# Patient Record
Sex: Male | Born: 1965 | Race: White | Hispanic: No | Smoking: Never smoker
Health system: Southern US, Community
[De-identification: ages and names within clinical notes are randomized; demographics above are authoritative.]

## PROBLEM LIST (undated history)

## (undated) DIAGNOSIS — E785 Hyperlipidemia, unspecified: Secondary | ICD-10-CM

## (undated) DIAGNOSIS — G709 Myoneural disorder, unspecified: Secondary | ICD-10-CM

## (undated) HISTORY — PX: INNER EAR SURGERY: SHX679

## (undated) HISTORY — PX: FINGER SURGERY: SHX640

## (undated) HISTORY — PX: TONSILLECTOMY: SUR1361

## (undated) HISTORY — DX: Hyperlipidemia, unspecified: E78.5

---

## 2008-04-05 ENCOUNTER — Encounter: Admission: RE | Admit: 2008-04-05 | Discharge: 2008-04-05 | Payer: Self-pay | Admitting: Neurology

## 2010-12-28 ENCOUNTER — Encounter: Payer: Self-pay | Admitting: Neurology

## 2011-07-27 LAB — LAB REPORT - SCANNED: HM HIV Screening: NEGATIVE

## 2011-10-20 ENCOUNTER — Other Ambulatory Visit: Payer: Self-pay | Admitting: Neurology

## 2011-10-20 DIAGNOSIS — R209 Unspecified disturbances of skin sensation: Secondary | ICD-10-CM

## 2011-10-20 DIAGNOSIS — R202 Paresthesia of skin: Secondary | ICD-10-CM

## 2011-11-11 ENCOUNTER — Ambulatory Visit
Admission: RE | Admit: 2011-11-11 | Discharge: 2011-11-11 | Disposition: A | Discharge status: Home / Self Care | Payer: PRIVATE HEALTH INSURANCE | Source: Ambulatory Visit | Attending: Neurology | Admitting: Neurology

## 2011-11-11 DIAGNOSIS — R202 Paresthesia of skin: Secondary | ICD-10-CM

## 2011-11-11 DIAGNOSIS — R209 Unspecified disturbances of skin sensation: Secondary | ICD-10-CM

## 2011-11-11 MED ORDER — IOHEXOL 300 MG/ML  SOLN
75.0000 mL | Freq: Once | INTRAMUSCULAR | Status: AC | PRN
  Administered 2011-11-11: 75 mL via INTRAVENOUS

## 2011-11-16 ENCOUNTER — Other Ambulatory Visit: Payer: Self-pay | Admitting: Neurology

## 2011-11-16 DIAGNOSIS — R202 Paresthesia of skin: Secondary | ICD-10-CM

## 2011-11-24 ENCOUNTER — Ambulatory Visit
Admission: RE | Admit: 2011-11-24 | Discharge: 2011-11-24 | Disposition: A | Discharge status: Home / Self Care | Payer: PRIVATE HEALTH INSURANCE | Source: Ambulatory Visit | Attending: Neurology | Admitting: Neurology

## 2011-11-24 DIAGNOSIS — R202 Paresthesia of skin: Secondary | ICD-10-CM

## 2011-11-24 NOTE — Progress Notes (Signed)
Brent Jones pt's blood to be sent with spinal fluid. Pt tolerated procedure well. Left antecubital site is unremarkable. Awaiting Dr. Benard Rink to do LP.

## 2011-11-24 NOTE — Patient Instructions (Addendum)
Lumbar Puncture Discharge Instructions  1. Go home and rest quietly for the next 24 hours.  It is important to lie flat for the next 24 hours.  Get up only to go to the restroom.  You may lie in the bed or on a couch on your back, your stomach, your left side or your right side.  You may have one pillow under your head.  You may have pillows between your knees while you are on your side or under your knees while you are on your back.  2. DO NOT drive today.  Recline the seat as far back as it will go, while still wearing your seat belt, on the way home.  3. You may get up to go to the bathroom as needed.  You may sit up for 10 minutes to eat.  You may resume your normal diet and medications unless otherwise indicated. Make sure to drink plenty of fluids today and tomorrow.  4. The incidence of headache, nausea, or vomiting is about 5% (one in 20 patients).  If you develop a headache, lie flat and drink plenty of fluids until the headache goes away.  Caffeinated beverages may be helpful.  If you develop severe nausea and vomiting or a headache that does not go away with flat bed rest, call [phone].  5. You may resume normal activities after your 24 hours of bed rest is over; however, do not exert yourself strongly or do any heavy lifting tomorrow.  6. Call your physician for a follow-up appointment.      7. If you have any questions or if complications develop after you arrive home, please call [phone].  Discharge instructions have been explained to the patient.  The patient, or the person responsible for the patient, fully understands these instructions.

## 2011-12-15 ENCOUNTER — Emergency Department (HOSPITAL_COMMUNITY)
Admission: EM | Admit: 2011-12-15 | Discharge: 2011-12-15 | Disposition: A | Discharge status: Home / Self Care | Payer: PRIVATE HEALTH INSURANCE | Attending: Emergency Medicine | Admitting: Emergency Medicine

## 2011-12-15 ENCOUNTER — Other Ambulatory Visit (HOSPITAL_COMMUNITY): Payer: PRIVATE HEALTH INSURANCE

## 2011-12-15 ENCOUNTER — Emergency Department (HOSPITAL_COMMUNITY): Payer: PRIVATE HEALTH INSURANCE

## 2011-12-15 ENCOUNTER — Encounter: Payer: Self-pay | Admitting: *Deleted

## 2011-12-15 DIAGNOSIS — R1031 Right lower quadrant pain: Secondary | ICD-10-CM

## 2011-12-15 DIAGNOSIS — R109 Unspecified abdominal pain: Secondary | ICD-10-CM | POA: Insufficient documentation

## 2011-12-15 LAB — COMPREHENSIVE METABOLIC PANEL
ALT: 25 U/L (ref 0–53)
AST: 29 U/L (ref 0–37)
Albumin: 4.7 g/dL (ref 3.5–5.2)
Alkaline Phosphatase: 72 U/L (ref 39–117)
Chloride: 101 mEq/L (ref 96–112)
Potassium: 3.7 mEq/L (ref 3.5–5.1)
Sodium: 137 mEq/L (ref 135–145)
Total Protein: 7.9 g/dL (ref 6.0–8.3)

## 2011-12-15 LAB — DIFFERENTIAL
Basophils Relative: 0 % (ref 0–1)
Eosinophils Absolute: 0 10*3/uL (ref 0.0–0.7)
Lymphs Abs: 1.5 10*3/uL (ref 0.7–4.0)
Neutro Abs: 12.5 10*3/uL — ABNORMAL HIGH (ref 1.7–7.7)
Neutrophils Relative %: 82 % — ABNORMAL HIGH (ref 43–77)

## 2011-12-15 LAB — CBC
MCH: 28.9 pg (ref 26.0–34.0)
MCHC: 34 g/dL (ref 30.0–36.0)
Platelets: 210 10*3/uL (ref 150–400)
RBC: 5.01 MIL/uL (ref 4.22–5.81)

## 2011-12-15 MED ORDER — OXYCODONE-ACETAMINOPHEN 5-325 MG PO TABS: 1.0000 | ORAL_TABLET | ORAL | Status: AC | PRN

## 2011-12-15 MED ORDER — ONDANSETRON HCL 4 MG/2ML IJ SOLN
4.0000 mg | INTRAMUSCULAR | Status: DC | PRN
  Administered 2011-12-15: 4 mg via INTRAVENOUS
  Filled 2011-12-15: qty 2

## 2011-12-15 MED ORDER — MORPHINE SULFATE 2 MG/ML IJ SOLN
2.0000 mg | INTRAMUSCULAR | Status: DC | PRN
  Administered 2011-12-15: 2 mg via INTRAVENOUS
  Filled 2011-12-15: qty 1

## 2011-12-15 MED ORDER — SODIUM CHLORIDE 0.9 % IV SOLN
Freq: Once | INTRAVENOUS | Status: AC
  Administered 2011-12-15: 11:00:00 via INTRAVENOUS

## 2011-12-15 MED ORDER — MORPHINE SULFATE 4 MG/ML IJ SOLN
4.0000 mg | Freq: Once | INTRAMUSCULAR | Status: AC
  Administered 2011-12-15: 4 mg via INTRAVENOUS
  Filled 2011-12-15: qty 1

## 2011-12-15 MED ORDER — KETOROLAC TROMETHAMINE 30 MG/ML IJ SOLN
30.0000 mg | Freq: Once | INTRAMUSCULAR | Status: AC
  Administered 2011-12-15: 30 mg via INTRAVENOUS
  Filled 2011-12-15: qty 1

## 2011-12-15 NOTE — ED Notes (Signed)
[id number]<AV> Expected date:<BR> Expected time:<BR> Means of arrival:<BR> Comments:<BR> Hyder from [employer]

## 2011-12-15 NOTE — ED Notes (Signed)
Pt given discharge instructions and verbalizes understanding  

## 2011-12-15 NOTE — ED Notes (Signed)
Pt reports acute onset RLQ pain at 0630 this am. Describes as colicky, then constant. Denies n/v/d.

## 2011-12-15 NOTE — ED Notes (Signed)
Sent for r/o appy.

## 2011-12-15 NOTE — ED Provider Notes (Signed)
History     CSN: [id number]  Arrival date & time 12/15/11  1038   None     Chief Complaint  Patient presents with  . Abdominal Pain    (Consider location/radiation/quality/duration/timing/severity/associated sxs/prior treatment) HPI  History reviewed. No pertinent past medical history.  Past Surgical History  Procedure Date  . Inner ear surgery implantable hearing aid-L  . Tonsillectomy   . Finger surgery     No family history on file.  History  Substance Use Topics  . Smoking status: Never Smoker   . Smokeless tobacco: Not on file  . Alcohol Use: Yes     occasionally      Review of Systems  Allergies  Review of patient's allergies indicates no known allergies.  Home Medications  No current outpatient prescriptions on file.  There were no vitals taken for this visit.  Physical Exam  ED Course  Procedures (including critical care time)   Labs Reviewed  CBC  DIFFERENTIAL  COMPREHENSIVE METABOLIC PANEL   No results found.   No diagnosis found.    MDM  Pt not seen by me        Doug Sou, MD 12/15/11 1247

## 2011-12-15 NOTE — Consult Note (Signed)
Reason for Consult:  Acute right lower quadrant pain Referring Physician:   Self  Brent Jones is an 46 y.o. male.  HPI: He awoke this morning with right lower quadrant pain that was colicky in nature.  The pain progressively worsened. I spoke with his wife and directed her to bring him to the [facility] emergency department so he can be evaluated.   The pain is associated with some nausea but no dysuria or hematuria. The pain radiates around to the right flank and he does feel some pressure in the suprapubic region. No fever is present but he has had some chills and sweats. He had a normal bowel movement this morning without relief of the pain. He has no history of gastrointestinal illnesses or kidney stones.  Past Medical History  Diagnosis Date  . Hearing loss     Past Surgical History  Procedure Date  . Inner ear surgery implantable hearing aid-L  . Tonsillectomy   . Finger surgery     Family History  Problem Relation Age of Onset  . Diabetes Father   . Hypertension Father     Social History:  reports that he has never smoked. He does not have any smokeless tobacco history on file. He reports that he drinks alcohol. He reports that he does not use illicit drugs.  Allergies: No Known Allergies  Medications: I have reviewed the patient's current medications which are none.  Results for orders placed during the hospital encounter of 12/15/11 (from the past 48 hour(s))  CBC     Status: Abnormal   Collection Time   12/15/11 10:51 AM      Component Value Range Comment   WBC 15.3 (*) 4.0 - 10.5 (K/uL)    RBC 5.01  4.22 - 5.81 (MIL/uL)    Hemoglobin 14.5  13.0 - 17.0 (g/dL)    HCT 16.1  09.6 - 04.5 (%)    MCV 85.0  78.0 - 100.0 (fL)    MCH 28.9  26.0 - 34.0 (pg)    MCHC 34.0  30.0 - 36.0 (g/dL)    RDW 40.9  81.1 - 91.4 (%)    Platelets 210  150 - 400 (K/uL)   DIFFERENTIAL     Status: Abnormal   Collection Time   12/15/11 10:51 AM      Component Value Range Comment   Neutrophils Relative 82 (*) 43 - 77 (%)    Neutro Abs 12.5 (*) 1.7 - 7.7 (K/uL)    Lymphocytes Relative 10 (*) 12 - 46 (%)    Lymphs Abs 1.5  0.7 - 4.0 (K/uL)    Monocytes Relative 8  3 - 12 (%)    Monocytes Absolute 1.2 (*) 0.1 - 1.0 (K/uL)    Eosinophils Relative 0  0 - 5 (%)    Eosinophils Absolute 0.0  0.0 - 0.7 (K/uL)    Basophils Relative 0  0 - 1 (%)    Basophils Absolute 0.0  0.0 - 0.1 (K/uL)     Ct Abdomen Pelvis Wo Contrast  12/15/2011  *RADIOLOGY REPORT*  Clinical Data: Right lower quadrant pain.  Renal colic.  CT ABDOMEN AND PELVIS WITHOUT CONTRAST  Technique:  Multidetector CT imaging of the abdomen and pelvis was performed following the standard protocol without intravenous contrast.  Comparison: None.  Findings: Several tiny 1-2 mm bilateral intrarenal calculi are seen.  Mild right hydronephrosis and ureterectasis is seen, with a 2-3 mm distal right ureteral calculus near the ureterovesicle junction.  There is  no evidence of left ureteral calculi or hydronephrosis.  A 3.9 cm cyst containing milk of calcium is noted in the upper pole of the left kidney.  The other abdominal parenchymal organs have a normal appearance on this noncontrast study.  Gallbladder is unremarkable.  No soft tissue masses are identified.  There is no evidence of inflammatory process or abnormal fluid collections.  No evidence of dilated bowel loops.  IMPRESSION:  1.  2-3 mm distal right ureteral calculus near the UVJ, causing mild right hydronephrosis. 2.  Tiny 1-2 mm nonobstructing bilateral intrarenal calculi. 3.  4 cm left upper pole renal cyst containing milk of calcium.  Original Report Authenticated By: Danae Orleans, M.D.    Review of Systems  Constitutional: Positive for chills and diaphoresis. Negative for fever.  HENT: Positive for hearing loss.   Respiratory: Negative for cough and shortness of breath.   Cardiovascular: Negative for chest pain.  Gastrointestinal: Positive for nausea and abdominal  pain. Negative for heartburn, vomiting and diarrhea.  Genitourinary: Negative for dysuria and hematuria.  Neurological: Positive for tingling (intermittent in LUE).   Blood pressure 115/61, pulse 50, temperature 98.7 F (37.1 C), temperature source Oral, resp. rate 18, SpO2 100.00%. Physical Exam  Constitutional: He appears well-developed and well-nourished.       Ill appearing.  HENT:  Head: Normocephalic and atraumatic.  Eyes: Conjunctivae and EOM are normal.  Neck: Neck supple.  Cardiovascular: Normal rate and regular rhythm.   No murmur heard. Respiratory: Effort normal and breath sounds normal.  GI: He exhibits no distension and no mass. There is tenderness (rlq, right flank, suprapubic areas). There is guarding.  Musculoskeletal: He exhibits no edema.  Lymphadenopathy:    He has no cervical adenopathy.  Skin: Skin is warm. He is diaphoretic.       Moist    Assessment/Plan: Right lower quadrant pain secondary to partially obstructing right ureteral stone which is currently at the urethrovesical junction. His symptoms are significantly improved since he was given Toradol. He has also had some morphine. I have discussed the situation with Dr. Barron Alvine from Urology.  No evidence of an acute right lower quadrant inflammatory process.  Plan: Discharge from the emergency department. We will try and see if he can spontaneously pass the stone.We'll have him strain his urine and collect the stone so he can take it to the urology office for evaluation. We'll have him use Toradol and Percocet for pain. He can arrange for followup with Dr. Isabel Caprice. If his clinical situation does not improve he may need cystoscopy and stone extraction and he knows to call Dr. Isabel Caprice for this as well.  Brent Jones J 12/15/2011, 11:51 AM

## 2012-03-14 IMAGING — CT CT ABD-PELV W/O CM
1 of 2 series · 15 of 32 positions shown, 19 images · non-contrast
Comparison: None.

CLINICAL DATA: Right lower quadrant pain.  Renal colic.

CT ABDOMEN AND PELVIS WITHOUT CONTRAST
TECHNIQUE: Multidetector CT imaging of the abdomen and pelvis was
performed following the standard protocol without intravenous
contrast.

[Series 2: abd/pel w/o · axial · non-contrast · 0.74mm/px · z∈[+1560,+1955]mm · 15 of 87 slices shown, 19 images]
[im 4/87  soft-tissue]
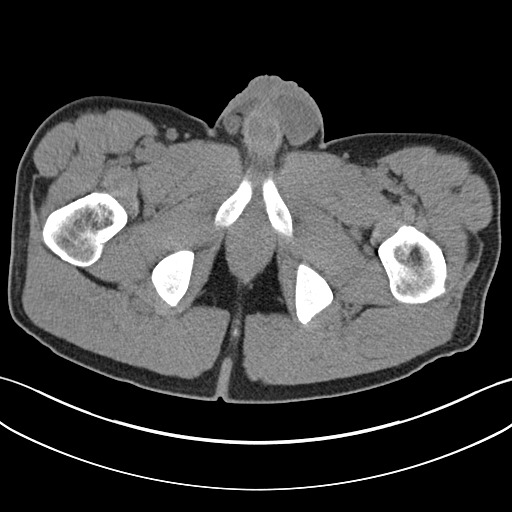
[im 4/87  bone]
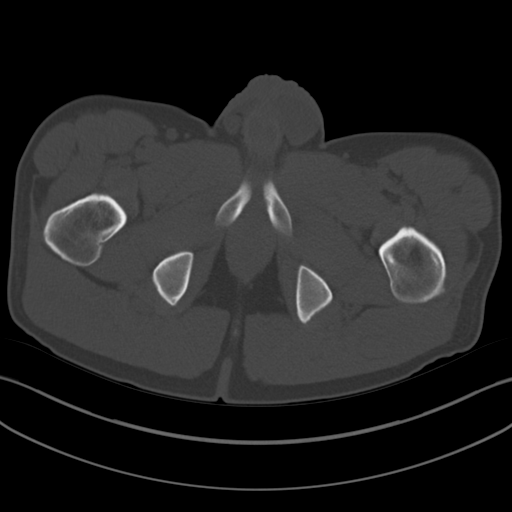
[im 11/87  soft-tissue]
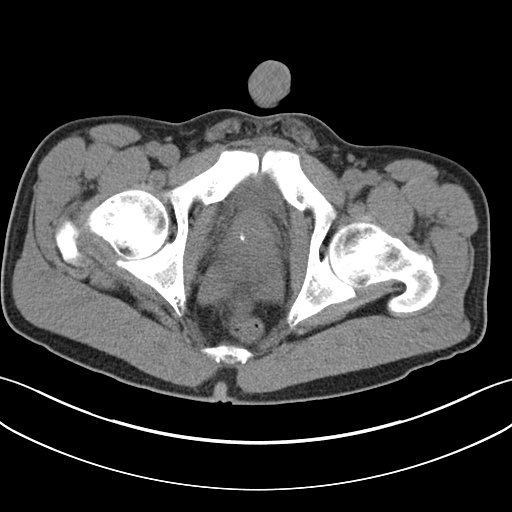
[im 18/87  soft-tissue]
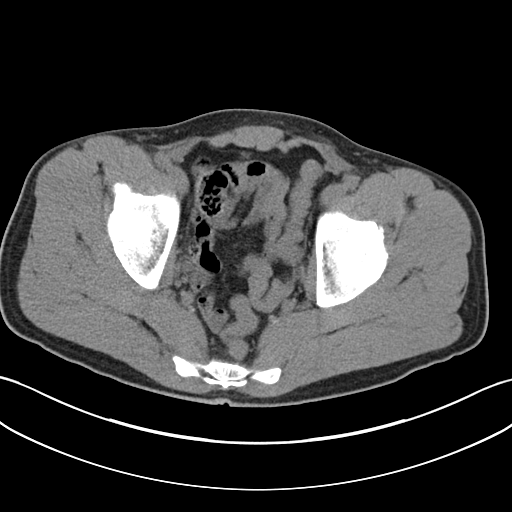
[im 26/87  soft-tissue]
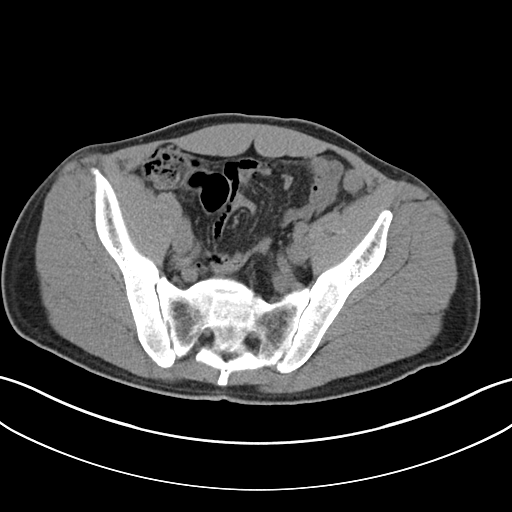
[im 29/87  soft-tissue]
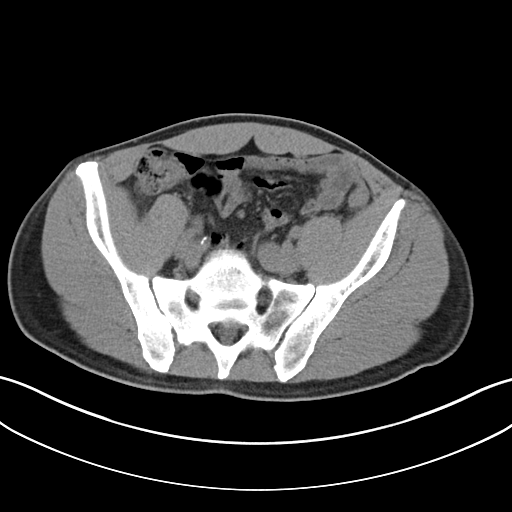
[im 36/87  soft-tissue]
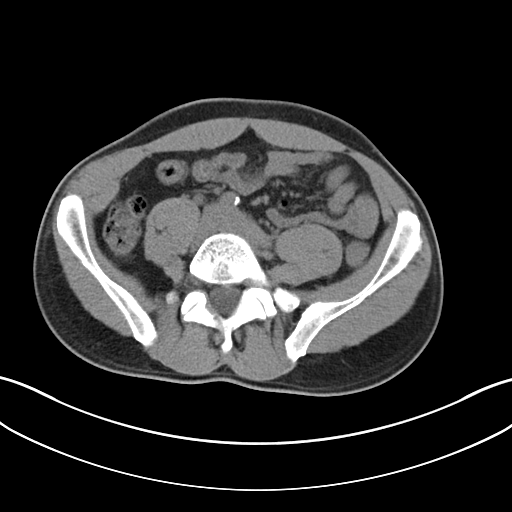
[im 44/87  soft-tissue]
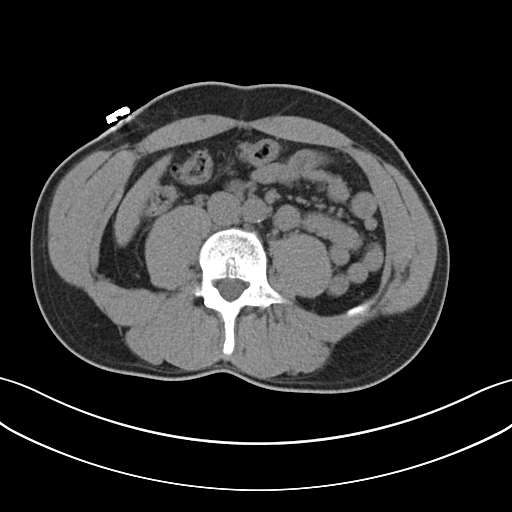
[im 51/87  soft-tissue]
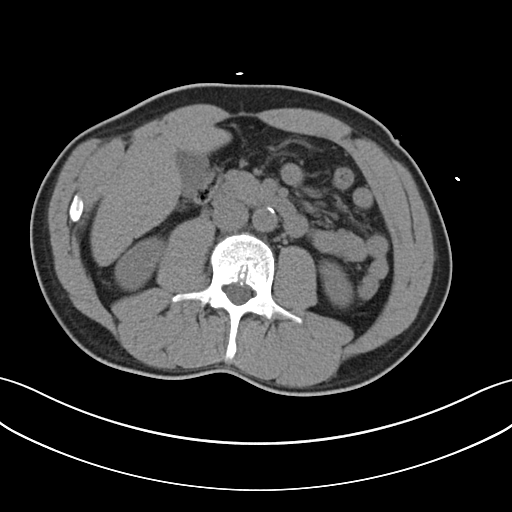
[im 58/87  soft-tissue]
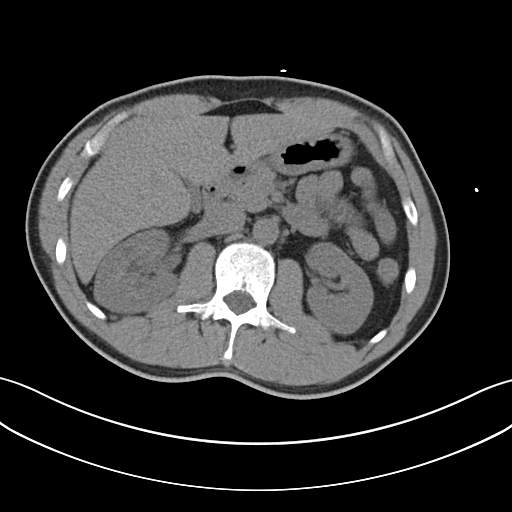
[im 58/87  bone]
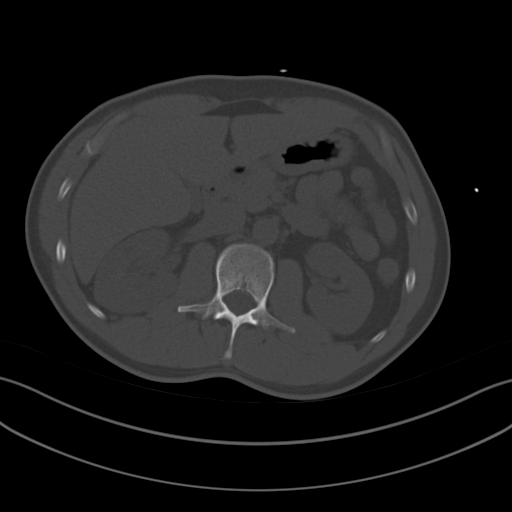
[im 61/87  soft-tissue]
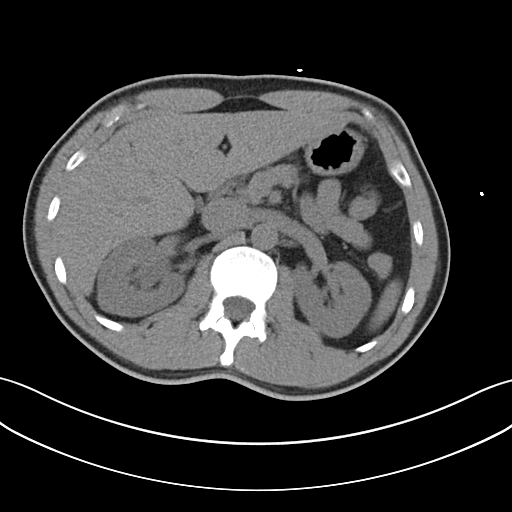
[im 69/87  soft-tissue]
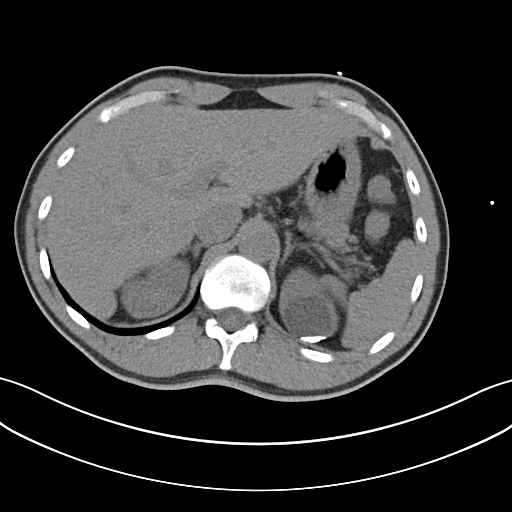
[im 72/87  lung]
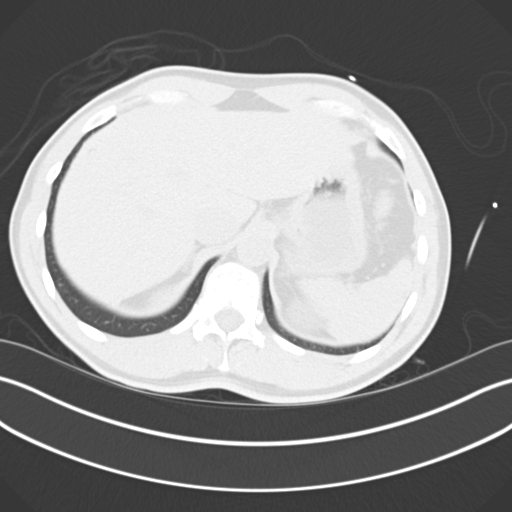
[im 76/87  soft-tissue]
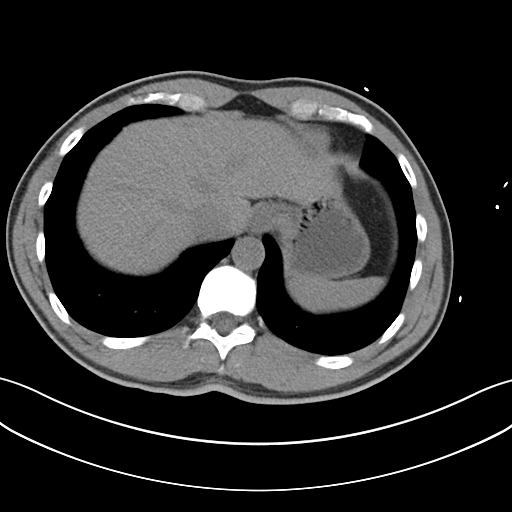
[im 76/87  lung]
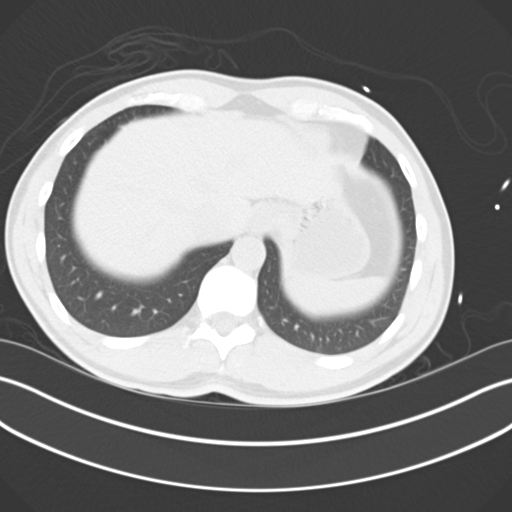
[im 79/87  lung]
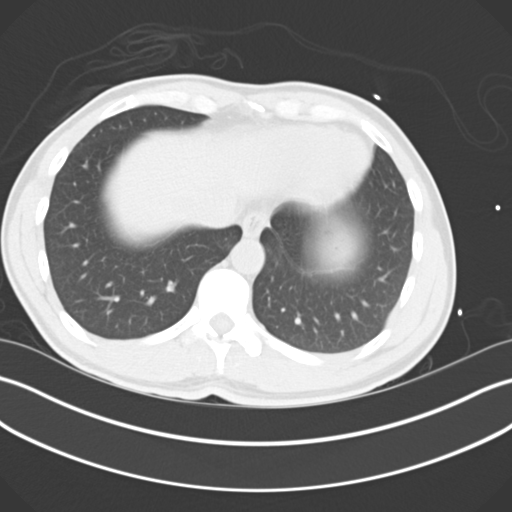
[im 83/87  soft-tissue]
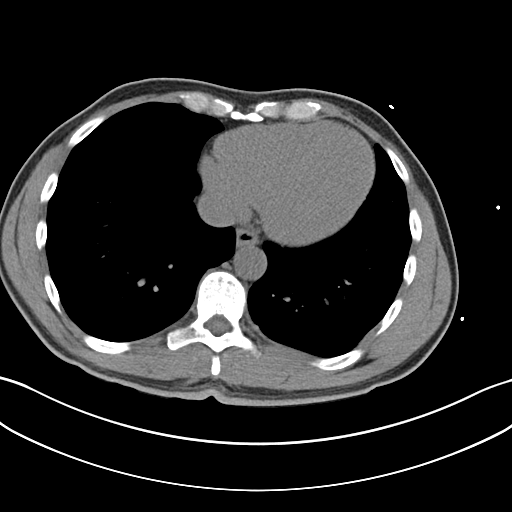
[im 83/87  lung]
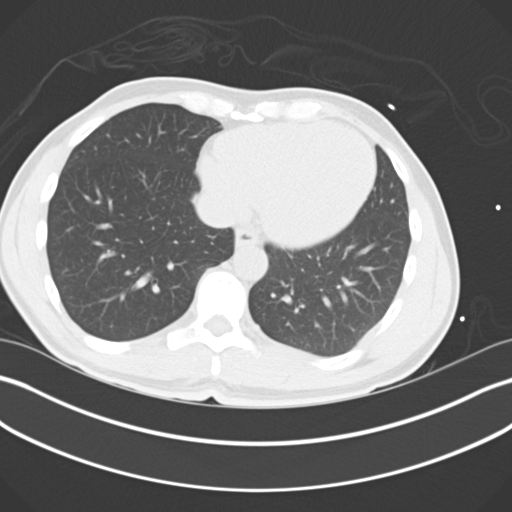

[15 of 32 positions shown; findings below may reference images not displayed]

FINDINGS: Several tiny 1-2 mm bilateral intrarenal calculi are
seen.  Mild right hydronephrosis and ureterectasis is seen, with a
2-3 mm distal right ureteral calculus near the ureterovesicle
junction.

There is no evidence of left ureteral calculi or hydronephrosis.  A
3.9 cm cyst containing milk of calcium is noted in the upper pole
of the left kidney.

The other abdominal parenchymal organs have a normal appearance on
this noncontrast study.  Gallbladder is unremarkable.  No soft
tissue masses are identified.  There is no evidence of inflammatory
process or abnormal fluid collections.  No evidence of dilated
bowel loops.
IMPRESSION: 1.  2-3 mm distal right ureteral calculus near the UVJ, causing
mild right hydronephrosis.
2.  Tiny 1-2 mm nonobstructing bilateral intrarenal calculi.
3.  4 cm left upper pole renal cyst containing milk of calcium.

## 2014-03-14 ENCOUNTER — Other Ambulatory Visit: Payer: Self-pay | Admitting: Neurology

## 2014-03-14 NOTE — Telephone Encounter (Signed)
Auth 1 refill via [facility].  Patient is a physician.

## 2014-06-01 ENCOUNTER — Ambulatory Visit (HOSPITAL_COMMUNITY)
Admission: EM | Admit: 2014-06-01 | Discharge: 2014-06-01 | Disposition: A | Discharge status: Home / Self Care | Payer: BC Managed Care – PPO | Source: Ambulatory Visit | Attending: Urology | Admitting: Urology

## 2014-06-01 ENCOUNTER — Other Ambulatory Visit: Payer: Self-pay | Admitting: Urology

## 2014-06-01 ENCOUNTER — Encounter (HOSPITAL_COMMUNITY): Payer: Self-pay | Admitting: *Deleted

## 2014-06-01 ENCOUNTER — Encounter (HOSPITAL_COMMUNITY)
Admission: EM | Disposition: A | Discharge status: Home / Self Care | Payer: Self-pay | Source: Ambulatory Visit | Attending: Urology

## 2014-06-01 ENCOUNTER — Ambulatory Visit (HOSPITAL_COMMUNITY): Payer: BC Managed Care – PPO | Admitting: Registered Nurse

## 2014-06-01 ENCOUNTER — Encounter (HOSPITAL_COMMUNITY): Payer: BC Managed Care – PPO | Admitting: Registered Nurse

## 2014-06-01 DIAGNOSIS — Z79899 Other long term (current) drug therapy: Secondary | ICD-10-CM | POA: Insufficient documentation

## 2014-06-01 DIAGNOSIS — N201 Calculus of ureter: Secondary | ICD-10-CM | POA: Insufficient documentation

## 2014-06-01 HISTORY — DX: Myoneural disorder, unspecified: G70.9

## 2014-06-01 HISTORY — PX: CYSTOSCOPY WITH RETROGRADE PYELOGRAM, URETEROSCOPY AND STENT PLACEMENT: SHX5789

## 2014-06-01 HISTORY — PX: HOLMIUM LASER APPLICATION: SHX5852

## 2014-06-01 LAB — BASIC METABOLIC PANEL
BUN: 23 mg/dL (ref 6–23)
CHLORIDE: 98 meq/L (ref 96–112)
CO2: 23 mEq/L (ref 19–32)
Calcium: 9.9 mg/dL (ref 8.4–10.5)
Creatinine, Ser: 1.55 mg/dL — ABNORMAL HIGH (ref 0.50–1.35)
GFR, EST AFRICAN AMERICAN: 59 mL/min — AB (ref >90)
GFR, EST NON AFRICAN AMERICAN: 51 mL/min — AB (ref >90)
Glucose, Bld: 90 mg/dL (ref 70–99)
POTASSIUM: 4.2 meq/L (ref 3.7–5.3)
SODIUM: 136 meq/L — AB (ref 137–147)

## 2014-06-01 SURGERY — CYSTOURETEROSCOPY, WITH RETROGRADE PYELOGRAM AND STENT INSERTION
Anesthesia: General | Site: Ureter | Laterality: Left

## 2014-06-01 MED ORDER — LIDOCAINE HCL (CARDIAC) 20 MG/ML IV SOLN
INTRAVENOUS | Status: DC | PRN
  Administered 2014-06-01: 80 mg via INTRAVENOUS

## 2014-06-01 MED ORDER — PROPOFOL 10 MG/ML IV BOLUS
INTRAVENOUS | Status: AC
Start: 2014-06-01 — End: 2014-06-01
  Filled 2014-06-01: qty 20

## 2014-06-01 MED ORDER — FENTANYL CITRATE 0.05 MG/ML IJ SOLN: 25.0000 ug | INTRAMUSCULAR | Status: DC | PRN

## 2014-06-01 MED ORDER — ONDANSETRON HCL 4 MG/2ML IJ SOLN
INTRAMUSCULAR | Status: AC
  Filled 2014-06-01: qty 2

## 2014-06-01 MED ORDER — OXYCODONE-ACETAMINOPHEN 5-325 MG PO TABS: 1.0000 | ORAL_TABLET | ORAL | Status: DC | PRN

## 2014-06-01 MED ORDER — CIPROFLOXACIN IN D5W 400 MG/200ML IV SOLN
INTRAVENOUS | Status: AC
  Filled 2014-06-01: qty 200

## 2014-06-01 MED ORDER — CIPROFLOXACIN IN D5W 400 MG/200ML IV SOLN
400.0000 mg | Freq: Once | INTRAVENOUS | Status: AC
  Administered 2014-06-01: 400 mg via INTRAVENOUS

## 2014-06-01 MED ORDER — METOCLOPRAMIDE HCL 5 MG/ML IJ SOLN
INTRAMUSCULAR | Status: DC | PRN
  Administered 2014-06-01: 10 mg via INTRAVENOUS

## 2014-06-01 MED ORDER — LIDOCAINE HCL 2 % EX GEL
CUTANEOUS | Status: AC
  Filled 2014-06-01: qty 10

## 2014-06-01 MED ORDER — LACTATED RINGERS IV SOLN
INTRAVENOUS | Status: DC
  Administered 2014-06-01: 18:00:00 via INTRAVENOUS

## 2014-06-01 MED ORDER — LIDOCAINE HCL 2 % EX GEL
CUTANEOUS | Status: DC | PRN
  Administered 2014-06-01: 1 via TOPICAL

## 2014-06-01 MED ORDER — PHENAZOPYRIDINE HCL 100 MG PO TABS: 100.0000 mg | ORAL_TABLET | Freq: Three times a day (TID) | ORAL | Status: DC | PRN

## 2014-06-01 MED ORDER — PROPOFOL 10 MG/ML IV BOLUS
INTRAVENOUS | Status: AC
  Filled 2014-06-01: qty 20

## 2014-06-01 MED ORDER — DEXAMETHASONE SODIUM PHOSPHATE 10 MG/ML IJ SOLN
INTRAMUSCULAR | Status: DC | PRN
  Administered 2014-06-01: 10 mg via INTRAVENOUS

## 2014-06-01 MED ORDER — BELLADONNA ALKALOIDS-OPIUM 16.2-60 MG RE SUPP
RECTAL | Status: AC
  Filled 2014-06-01: qty 1

## 2014-06-01 MED ORDER — FENTANYL CITRATE 0.05 MG/ML IJ SOLN
INTRAMUSCULAR | Status: DC | PRN
  Administered 2014-06-01 (×3): 50 ug via INTRAVENOUS

## 2014-06-01 MED ORDER — IOHEXOL 300 MG/ML  SOLN
INTRAMUSCULAR | Status: DC | PRN
  Administered 2014-06-01: 5 mL via URETHRAL

## 2014-06-01 MED ORDER — FENTANYL CITRATE 0.05 MG/ML IJ SOLN
INTRAMUSCULAR | Status: AC
  Filled 2014-06-01: qty 5

## 2014-06-01 MED ORDER — MIDAZOLAM HCL 5 MG/5ML IJ SOLN
INTRAMUSCULAR | Status: DC | PRN
  Administered 2014-06-01: 2 mg via INTRAVENOUS

## 2014-06-01 MED ORDER — 0.9 % SODIUM CHLORIDE (POUR BTL) OPTIME
TOPICAL | Status: DC | PRN
  Administered 2014-06-01: 1000 mL

## 2014-06-01 MED ORDER — KETOROLAC TROMETHAMINE 30 MG/ML IJ SOLN
30.0000 mg | Freq: Once | INTRAMUSCULAR | Status: AC
  Administered 2014-06-01: 30 mg via INTRAVENOUS

## 2014-06-01 MED ORDER — KETOROLAC TROMETHAMINE 30 MG/ML IJ SOLN
INTRAMUSCULAR | Status: AC
  Filled 2014-06-01: qty 1

## 2014-06-01 MED ORDER — MIDAZOLAM HCL 2 MG/2ML IJ SOLN
INTRAMUSCULAR | Status: AC
Start: 2014-06-01 — End: 2014-06-01
  Filled 2014-06-01: qty 2

## 2014-06-01 MED ORDER — PROMETHAZINE HCL 25 MG/ML IJ SOLN: 6.2500 mg | INTRAMUSCULAR | Status: DC | PRN

## 2014-06-01 MED ORDER — ONDANSETRON HCL 4 MG/2ML IJ SOLN
INTRAMUSCULAR | Status: DC | PRN
  Administered 2014-06-01: 4 mg via INTRAVENOUS

## 2014-06-01 MED ORDER — LIDOCAINE HCL (CARDIAC) 20 MG/ML IV SOLN
INTRAVENOUS | Status: AC
Start: 2014-06-01 — End: 2014-06-01
  Filled 2014-06-01: qty 5

## 2014-06-01 MED ORDER — PROPOFOL 10 MG/ML IV BOLUS
INTRAVENOUS | Status: DC | PRN
  Administered 2014-06-01: 20 mg via INTRAVENOUS
  Administered 2014-06-01: 200 mg via INTRAVENOUS

## 2014-06-01 SURGICAL SUPPLY — 15 items
BAG URO CATCHER STRL LF (DRAPE) ×3 IMPLANT
BASKET ZERO TIP NITINOL 2.4FR (BASKET) ×3 IMPLANT
BSKT STON RTRVL ZERO TP 2.4FR (BASKET) ×1
CATH INTERMIT  6FR 70CM (CATHETERS) ×3 IMPLANT
CLOTH BEACON ORANGE TIMEOUT ST (SAFETY) ×3 IMPLANT
DRAPE CAMERA CLOSED 9X96 (DRAPES) ×3 IMPLANT
FIBER LASER FLEXIVA 365 (UROLOGICAL SUPPLIES) ×3 IMPLANT
GLOVE BIOGEL M STRL SZ7.5 (GLOVE) ×3 IMPLANT
GOWN STRL REUS W/TWL LRG LVL3 (GOWN DISPOSABLE) ×6 IMPLANT
GUIDEWIRE ANG ZIPWIRE 038X150 (WIRE) IMPLANT
GUIDEWIRE STR DUAL SENSOR (WIRE) ×3 IMPLANT
MANIFOLD NEPTUNE II (INSTRUMENTS) ×3 IMPLANT
PACK CYSTO (CUSTOM PROCEDURE TRAY) ×3 IMPLANT
TUBING CONNECTING 10 (TUBING) ×2 IMPLANT
TUBING CONNECTING 10' (TUBING) ×1

## 2014-06-01 NOTE — Discharge Instructions (Signed)
1. You may see some blood in the urine and may have some burning with urination for 48-72 hours. You also may notice that you have to urinate more frequently or urgently after your procedure which is normal.  °2. You should call should you develop an inability urinate, fever > 101, persistent nausea and vomiting that prevents you from eating or drinking to stay hydrated.  °

## 2014-06-01 NOTE — Progress Notes (Signed)
Wife came to RN to say her husband had a headache and left flank pain. Called and got orders for pain medications. Patient refused pain medications or IV start at this time.

## 2014-06-01 NOTE — Anesthesia Preprocedure Evaluation (Addendum)
Anesthesia Evaluation  Patient identified by MRN, date of birth, ID band Patient awake    Reviewed: Allergy & Precautions, H&P , NPO status , Patient's Chart, lab work & pertinent test results  Airway Mallampati: II TM Distance: >3 FB Neck ROM: Full    Dental no notable dental hx.    Pulmonary neg pulmonary ROS,  breath sounds clear to auscultation  Pulmonary exam normal       Cardiovascular Exercise Tolerance: Good negative cardio ROS  Rhythm:Regular Rate:Normal     Neuro/Psych Has elements of Multiple Sclerosis, but spinal taps negative. Intermittent feet paresthesias.   His symptoms are worsened by cold environments and improved in warm settings.  He has a removable hearing aid right ear (currently out) and an implanted left hearing aid under scalp behind left ear which he prefers to leave on during procedure.  Neuromuscular disease negative psych ROS   GI/Hepatic negative GI ROS, Neg liver ROS,   Endo/Other  negative endocrine ROS  Renal/GU negative Renal ROS  negative genitourinary   Musculoskeletal negative musculoskeletal ROS (+)   Abdominal   Peds negative pediatric ROS (+)  Hematology negative hematology ROS (+)   Anesthesia Other Findings   Reproductive/Obstetrics negative OB ROS                         Anesthesia Physical Anesthesia Plan  ASA: II  Anesthesia Plan: General   Post-op Pain Management:    Induction: Intravenous  Airway Management Planned: LMA  Additional Equipment:   Intra-op Plan:   Post-operative Plan: Extubation in OR  Informed Consent: I have reviewed the patients History and Physical, chart, labs and discussed the procedure including the risks, benefits and alternatives for the proposed anesthesia with the patient or authorized representative who has indicated his/her understanding and acceptance.   Dental advisory given  Plan Discussed with:  CRNA  Anesthesia Plan Comments:         Anesthesia Quick Evaluation

## 2014-06-01 NOTE — Transfer of Care (Signed)
Immediate Anesthesia Transfer of Care Note  Patient: Brent Jones  Procedure(s) Performed: Procedure(s): CYSTOSCOPY WITH RETROGRADE PYELOGRAM, URETEROSCOPY (Left) HOLMIUM LASER APPLICATION (Left)  Patient Location: PACU  Anesthesia Type:General  Level of Consciousness: awake, alert , oriented and patient cooperative  Airway & Oxygen Therapy: Patient Spontanous Breathing and Patient connected to face mask oxygen  Post-op Assessment: Report given to PACU RN, Post -op Vital signs reviewed and stable and Patient moving all extremities  Post vital signs: Reviewed and stable  Complications: No apparent anesthesia complications

## 2014-06-01 NOTE — H&P (Signed)
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 He does have a history of urolithiasis and passed his first stone spontaneously about 2 years ago. His current pain started the last couple of days and has been severe at times. It feels similar to his last stone episode. He has denied any fever although has been chilled. He has had some nausea although has not vomited. He denies any gross hematuria. His pain has been mostly located in his left flank without radiation to his abdomen.   Past Medical History Questionable neurologic disease.   Surgical History Problems  1. History of Ear Surgery 2. History of Tonsillectomy Over Age 37  Current Meds 1. Tecfidera 240 MG Oral Capsule Delayed Release;  Therapy: (Recorded:26Jun2015) to Recorded  Allergies Medication  1. No Known Drug Allergies  Family History Problems  1. Family history of diabetes mellitus (V18.0) : Father 2. Family history of hypertension (V17.49) : Mother, Father  Social History Problems    Alcohol use (V49.89)   Married   Never a smoker  Review of Systems Genitourinary, constitutional, skin, eye, otolaryngeal, hematologic/lymphatic, cardiovascular, pulmonary, endocrine, musculoskeletal, gastrointestinal, neurological and psychiatric system(s) were reviewed and pertinent findings if present are noted.  Gastrointestinal: nausea.  Musculoskeletal: back pain.    Vitals Vital Signs [Data Includes: Last 1 Day]  Recorded: 26Jun2015 10:58AM  Height: 6 ft  Weight: 178 lb  BMI Calculated: 24.14 BSA Calculated: 2.03 Blood Pressure: 123 / 72 Temperature: 98.4 F Heart Rate: 65  Physical Exam Constitutional: Well nourished and well developed . No acute distress.  ENT:. The ears and nose are normal in appearance.  Neck: The appearance of the neck is normal and no neck  mass is present.  Pulmonary: No respiratory distress, normal respiratory rhythm and effort and clear bilateral breath sounds.  Cardiovascular: Heart rate and rhythm are normal . No peripheral edema.  Abdomen: The abdomen is soft and nontender. No masses are palpated. mild left CVA tenderness no CVA tenderness. No hernias are palpable. No hepatosplenomegaly noted.  Skin: Normal skin turgor and no visible rash.  Neuro/Psych:. Mood and affect are appropriate.    Results/Data Urine [Data Includes: Last 1 Day]   26Jun2015  COLOR YELLOW   APPEARANCE CLEAR   SPECIFIC GRAVITY 1.025   pH 6.0   GLUCOSE NEG mg/dL  BILIRUBIN NEG   KETONE TRACE mg/dL  BLOOD MOD   PROTEIN TRACE mg/dL  UROBILINOGEN 0.2 mg/dL  NITRITE NEG   LEUKOCYTE ESTERASE NEG   SQUAMOUS EPITHELIAL/HPF RARE   WBC 0-2 WBC/hpf  RBC 11-20 RBC/hpf  BACTERIA RARE   CRYSTALS NONE SEEN   CASTS NONE SEEN   Selected Results  AU CT-STONE PROTOCOL 26Jun2015 12:00AM Heloise Purpura   Test Name Result Flag Reference  CT-STONE PROTOCOL (Report)    ** RADIOLOGY REPORT BY [facility] **   CLINICAL DATA: Hematuria. Left flank pain.  EXAM: CT ABDOMEN AND PELVIS WITHOUT CONTRAST (URINARY CALCULUS PROTOCOL)  TECHNIQUE: Multidetector CT imaging was performed through the abdomen and pelvis without intravenous contrast to include the urinary tract.  COMPARISON: CT of the abdomen and pelvis 12/15/2011.  FINDINGS: Lung Bases: Unremarkable.  Abdomen/Pelvis: There are two 5 mm calculi in the distal third of the left ureter at and immediately before the left ureterovesicular junction. These are associated with mild proximal hydroureteronephrosis and perinephric stranding, indicating  mild obstruction at this time. Multiple additional nonobstructive calculi are noted within the collecting systems of the kidneys bilaterally, largest of which measures 4 mm. Again noted is a 4.3 cm lesion in the upper pole of the left kidney  which is generally low-attenuation, but has some layering dependent high attenuation material compatible with milk-of-calcium within a cyst. The unenhanced appearance of the right kidney is unremarkable.  The unenhanced appearance of visualized the liver, gallbladder, pancreas, spleen and bilateral adrenal glands is unremarkable. Atherosclerosis throughout the abdominal and pelvic vasculature, without evidence of aneurysm. No significant volume of ascites. No pneumoperitoneum. No pathologic distention of small bowel. No lymphadenopathy identified within the abdomen or pelvis on today's non contrast CT examination. Image 48 of series 2 demonstrates an area of apparent asymmetric wall thickening associated with the proximal sigmoid colon, which is also visualized on image 35 of series 611 and image 93 of series 615.  Musculoskeletal: There are no aggressive appearing lytic or blastic lesions noted in the visualized portions of the skeleton.  IMPRESSION: 1. There appear to be two calculi in the distal left ureter at and immediately before the left ureterovesicular junction measuring 5 mm each, which appear partially obstructive as evidenced by mild proximal left hydroureteronephrosis and perinephric stranding. 2. Multiple additional nonobstructive calculi within the collecting systems of the kidneys bilaterally, as above. 3. 4.3 cm cyst with milk of calcium in the upper pole of the left kidney, similar to prior studies. 4. Image 48 of series 2 demonstrates an area of asymmetric mural thickening in the proximal sigmoid colon which is somewhat masslike in appearance. This may be artifactual related to under distention of the colon in this region, however, followup nonemergent colonoscopy in the near future is strongly recommended if clinically appropriate.   Electronically Signed  By: Trudie Reedaniel Entrikin M.D.  On: 06/01/2014 11:47    I independently reviewed his CT scan. He appears to  either have a 1 cm left distal stone or two 5 mm distal stones next to each other. He has associated left-sided hydronephrosis and multiple small bilateral nonobstructing stones.    He also has a questionable proximal sigmoid mass.  Discussion/Summary 1. Left ureteral calculi: I reviewed his CT findings with him today. Considering his stone burden, he understands that while it is reasonable to consider continued medical expulsion therapy, the odds of him passing a stone are relatively small. We discussed options for treatment including ureteroscopic laser lithotripsy or shockwave lithotripsy. His stone is not very easily seen on his scout film. After reviewing the pros and cons of each approach and considering his desire to resolve his pain sooner rather than later, he has elected to proceed with left ureteroscopic laser lithotripsy. We have reviewed the potential risks and complications as well as the expected recovery process. This will be scheduled for later today.    2. Questionable proximal sigmoid mass: I have discussed this finding with Onalee Huaavid in detail. He is planning on proceeding with a colonoscopy later this summer both for routine screening purposes in now because of his CT finding. I recommended that he schedule an appointment with a gastroenterologist in the near future.     Signatures Electronically signed by : Heloise PurpuraLester Morton Simson, M.D.; Jun 01 2014  5:41PM EST

## 2014-06-01 NOTE — Op Note (Signed)
Preoperative diagnosis: Left ureteral calculus  Postoperative diagnosis: Left ureteral calculus  Procedure:  1. Cystoscopy 2. Left ureteroscopy and stone removal 3. Ureteroscopic laser lithotripsy 4. Left retrograde pyelography with interpretation  Surgeon: Moody Bruins. M.D.  Anesthesia: General  Complications: None  Intraoperative findings: Left retrograde pyelography demonstrated a filling defect within the distal left ureter consistent with the patient's known calculus without other abnormalities.  EBL: Minimal  Specimens: 1. Left ureteral calculus  Disposition of specimens: [facility] for stone analysis  Indication: Brent Jones is a 48 y.o. year old patient with urolithiasis and an obstructing left ureteral stone. After reviewing the management options for treatment, the patient elected to proceed with the above surgical procedure(s). We have discussed the potential benefits and risks of the procedure, side effects of the proposed treatment, the likelihood of the patient achieving the goals of the procedure, and any potential problems that might occur during the procedure or recuperation. Informed consent has been obtained.  Description of procedure:  The patient was taken to the operating room and general anesthesia was induced.  The patient was placed in the dorsal lithotomy position, prepped and draped in the usual sterile fashion, and preoperative antibiotics were administered. A preoperative time-out was performed.   Cystourethroscopy was performed.  The patient's urethra was examined and was normal. The bladder was then systematically examined in its entirety. There was no evidence for any bladder tumors, stones, or other mucosal pathology.    Attention then turned to the left ureteral orifice and a ureteral catheter was used to intubate the ureteral orifice.  Omnipaque contrast was injected through the ureteral catheter and a retrograde pyelogram  was performed with findings as dictated above.  A 0.38 sensor guidewire was then advanced up the left ureter into the renal pelvis under fluoroscopic guidance. The 6 Fr semirigid ureteroscope was then advanced into the ureter next to the guidewire and the calculus was identified.   The stone was then fragmented with the 365 micron holmium laser fiber on a setting of 0.5J and frequency of 6 Hz.   All stones were then removed from the ureter with a zero tip nitinol basket.  Reinspection of the ureter revealed no remaining visible stones or fragments.   There did not appear to be severe ureteral edema and it was decided not to place a postoperative ureteral stent as the procedure was uncomplicated.  The bladder was then emptied and the procedure ended.  The patient appeared to tolerate the procedure well and without complications.  The patient was able to be awakened and transferred to the recovery unit in satisfactory condition.

## 2014-06-02 NOTE — Anesthesia Postprocedure Evaluation (Signed)
  Anesthesia Post-op Note  Patient: Brent Jones  Procedure(s) Performed: Procedure(s) (LRB): CYSTOSCOPY WITH RETROGRADE PYELOGRAM, URETEROSCOPY (Left) HOLMIUM LASER APPLICATION (Left)  Patient Location: PACU  Anesthesia Type: General  Level of Consciousness: awake and alert   Airway and Oxygen Therapy: Patient Spontanous Breathing  Post-op Pain: mild  Post-op Assessment: Post-op Vital signs reviewed, Patient's Cardiovascular Status Stable, Respiratory Function Stable, Patent Airway and No signs of Nausea or vomiting  Last Vitals:  Filed Vitals:   06/01/14 1930  BP: 118/65  Pulse: 78  Temp: 36.5 C  Resp: 20    Post-op Vital Signs: stable   Complications: No apparent anesthesia complications

## 2014-06-04 ENCOUNTER — Encounter (HOSPITAL_COMMUNITY): Payer: Self-pay | Admitting: Urology

## 2015-01-03 ENCOUNTER — Telehealth: Payer: Self-pay | Admitting: Neurology

## 2015-01-03 NOTE — Telephone Encounter (Signed)
Christina with [facility] @ [phone], requesting prior authorization for Rx TECFIDERA 240 MG CPDR.  Patient out of medication.  Please fax to prior authorization @ [phone].

## 2015-01-03 NOTE — Telephone Encounter (Signed)
All clinical info has been sent to ins.  Request is currently under review.

## 2015-01-08 NOTE — Telephone Encounter (Signed)
Ins responded by saying the patient already has an approval on file for this med through a different provider.

## 2015-07-05 ENCOUNTER — Ambulatory Visit: Payer: Self-pay | Admitting: Podiatry

## 2017-02-15 ENCOUNTER — Other Ambulatory Visit: Payer: Self-pay | Admitting: Orthopedic Surgery

## 2017-02-15 DIAGNOSIS — M25511 Pain in right shoulder: Principal | ICD-10-CM

## 2017-02-15 DIAGNOSIS — G8929 Other chronic pain: Secondary | ICD-10-CM

## 2017-03-04 ENCOUNTER — Other Ambulatory Visit: Payer: Self-pay

## 2017-03-04 ENCOUNTER — Inpatient Hospital Stay
Admission: RE | Admit: 2017-03-04 | Discharge: 2017-03-04 | Disposition: A | Discharge status: Home / Self Care | Payer: Self-pay | Source: Ambulatory Visit | Attending: Orthopedic Surgery | Admitting: Orthopedic Surgery

## 2017-04-30 ENCOUNTER — Ambulatory Visit
Admission: RE | Admit: 2017-04-30 | Discharge: 2017-04-30 | Disposition: A | Discharge status: Home / Self Care | Payer: Managed Care, Other (non HMO) | Source: Ambulatory Visit | Attending: Orthopedic Surgery | Admitting: Orthopedic Surgery

## 2017-04-30 ENCOUNTER — Ambulatory Visit
Admission: RE | Admit: 2017-04-30 | Discharge: 2017-04-30 | Disposition: A | Discharge status: Home / Self Care | Payer: 59 | Source: Ambulatory Visit | Attending: Orthopedic Surgery | Admitting: Orthopedic Surgery

## 2017-04-30 DIAGNOSIS — M25511 Pain in right shoulder: Principal | ICD-10-CM

## 2017-04-30 DIAGNOSIS — G8929 Other chronic pain: Secondary | ICD-10-CM

## 2017-04-30 MED ORDER — IOPAMIDOL (ISOVUE-M 200) INJECTION 41%
15.0000 mL | Freq: Once | INTRAMUSCULAR | Status: AC
  Administered 2017-04-30: 15 mL via INTRA_ARTICULAR

## 2018-08-15 ENCOUNTER — Other Ambulatory Visit (HOSPITAL_COMMUNITY): Payer: Self-pay | Admitting: Nurse Practitioner

## 2018-08-15 DIAGNOSIS — G939 Disorder of brain, unspecified: Secondary | ICD-10-CM

## 2018-08-24 ENCOUNTER — Ambulatory Visit (HOSPITAL_COMMUNITY)
Admission: RE | Admit: 2018-08-24 | Discharge: 2018-08-24 | Disposition: A | Discharge status: Home / Self Care | Payer: Managed Care, Other (non HMO) | Source: Ambulatory Visit | Attending: Nurse Practitioner | Admitting: Nurse Practitioner

## 2018-08-24 DIAGNOSIS — R9082 White matter disease, unspecified: Secondary | ICD-10-CM | POA: Diagnosis not present

## 2018-08-24 DIAGNOSIS — G939 Disorder of brain, unspecified: Secondary | ICD-10-CM | POA: Diagnosis present

## 2018-08-24 MED ORDER — GADOBUTROL 1 MMOL/ML IV SOLN
10.0000 mL | Freq: Once | INTRAVENOUS | Status: AC | PRN
  Administered 2018-08-24: 8 mL via INTRAVENOUS

## 2018-12-07 HISTORY — PX: COLONOSCOPY: SHX174

## 2019-11-22 ENCOUNTER — Ambulatory Visit: Payer: Self-pay | Admitting: *Deleted

## 2019-11-22 NOTE — Telephone Encounter (Signed)
Pt called about COVID testing for his family on 11/30/2019; he says they have a flight on 12/04/2019, and can test no more than 5 days prior; explained there are sites at [facility] that require an appointment; pt already aware of [facility] site; pt also given number to [facility]; also explained the turn around time for result is based on the number of tests to be completed; he verbalized understanding.  Reason for Disposition . General information question, no triage required and triager able to answer question  Answer Assessment - Initial Assessment Questions 1. REASON FOR CALL or QUESTION: "What is your reason for calling today?" or "How can I best help you?" or "What question do you have that I can help answer?"     Is [facility] open for COVID testing on  11/30/2019  Protocols used: [place]

## 2019-11-29 ENCOUNTER — Other Ambulatory Visit: Payer: Managed Care, Other (non HMO)

## 2019-11-29 ENCOUNTER — Ambulatory Visit: Payer: BC Managed Care – PPO | Attending: Internal Medicine

## 2019-11-29 DIAGNOSIS — Z20822 Contact with and (suspected) exposure to covid-19: Secondary | ICD-10-CM

## 2019-12-01 LAB — NOVEL CORONAVIRUS, NAA: SARS-CoV-2, NAA: NOT DETECTED

## 2021-01-13 ENCOUNTER — Other Ambulatory Visit (HOSPITAL_COMMUNITY): Payer: Self-pay | Admitting: Neurology

## 2021-01-13 ENCOUNTER — Other Ambulatory Visit: Payer: Self-pay | Admitting: Neurology

## 2021-01-13 DIAGNOSIS — G35 Multiple sclerosis: Secondary | ICD-10-CM

## 2021-01-21 ENCOUNTER — Ambulatory Visit (HOSPITAL_COMMUNITY): Admission: RE | Admit: 2021-01-21 | Payer: BC Managed Care – PPO | Source: Ambulatory Visit

## 2021-03-10 ENCOUNTER — Other Ambulatory Visit (HOSPITAL_COMMUNITY): Payer: Self-pay | Admitting: *Deleted

## 2021-03-14 ENCOUNTER — Ambulatory Visit (HOSPITAL_COMMUNITY)
Admission: RE | Admit: 2021-03-14 | Discharge: 2021-03-14 | Disposition: A | Discharge status: Home / Self Care | Payer: Self-pay | Source: Ambulatory Visit | Attending: Cardiology | Admitting: Cardiology

## 2021-03-14 ENCOUNTER — Other Ambulatory Visit: Payer: Self-pay

## 2021-03-19 ENCOUNTER — Ambulatory Visit: Payer: BC Managed Care – PPO

## 2022-07-09 ENCOUNTER — Other Ambulatory Visit: Payer: Self-pay | Admitting: Obstetrics and Gynecology

## 2022-07-09 ENCOUNTER — Ambulatory Visit
Admission: RE | Admit: 2022-07-09 | Discharge: 2022-07-09 | Disposition: A | Discharge status: Home / Self Care | Payer: 59 | Source: Ambulatory Visit | Attending: Obstetrics and Gynecology | Admitting: Obstetrics and Gynecology

## 2022-07-09 DIAGNOSIS — Z09 Encounter for follow-up examination after completed treatment for conditions other than malignant neoplasm: Secondary | ICD-10-CM

## 2022-12-07 DIAGNOSIS — I4891 Unspecified atrial fibrillation: Secondary | ICD-10-CM

## 2022-12-07 HISTORY — DX: Unspecified atrial fibrillation: I48.91

## 2023-02-02 ENCOUNTER — Ambulatory Visit: Payer: 59 | Attending: Cardiovascular Disease | Admitting: Cardiovascular Disease

## 2023-02-02 ENCOUNTER — Encounter: Payer: Self-pay | Admitting: Cardiovascular Disease

## 2023-02-02 VITALS — BP 110/85 | HR 85 | Ht 73.0 in | Wt 180.0 lb

## 2023-02-02 DIAGNOSIS — I4891 Unspecified atrial fibrillation: Secondary | ICD-10-CM | POA: Diagnosis not present

## 2023-02-02 MED ORDER — DILTIAZEM HCL ER COATED BEADS 120 MG PO CP24: 120.0000 mg | ORAL_CAPSULE | Freq: Every day | ORAL | 3 refills | Status: DC

## 2023-02-02 MED ORDER — RIVAROXABAN 20 MG PO TABS: 20.0000 mg | ORAL_TABLET | Freq: Every day | ORAL | 3 refills | Status: DC

## 2023-02-02 NOTE — Patient Instructions (Signed)
Medication Instructions:  Your physician has recommended you make the following change in your medication:  1-START Cardizem 120 mg by mouth daily 2-START xarelto 20 mg by mouth daily  *If you need a refill on your cardiac medications before your next appointment, please call your pharmacy*  Lab Work: If you have labs (blood work) drawn today and your tests are completely normal, you will receive your results only by: [place] (if you have MyChart) OR A paper copy in the mail If you have any lab test that is abnormal or we need to change your treatment, we will call you to review the results.  Testing/Procedures: Your physician has requested that you have an echocardiogram. Echocardiography is a painless test that uses sound waves to create images of your heart. It provides your doctor with information about the size and shape of your heart and how well your heart's chambers and valves are working. This procedure takes approximately one hour. There are no restrictions for this procedure. Please do NOT wear cologne, perfume, aftershave, or lotions (deodorant is allowed). Please arrive 15 minutes prior to your appointment time.  Your physician has recommended that you wear an event monitor for 30 days. Event monitors are medical devices that record the heart's electrical activity. Doctors most often [place] these monitors to diagnose arrhythmias. Arrhythmias are problems with the speed or rhythm of the heartbeat. The monitor is a small, portable device. You can wear one while you do your normal daily activities. This is usually used to diagnose what is causing palpitations/syncope (passing out).  Follow-Up: At [facility], you and your health needs are our priority.  As part of our continuing mission to provide you with exceptional heart care, we have created designated Provider Care Teams.  These Care Teams include your primary Cardiologist (physician) and Advanced Practice Providers  (APPs -  Physician Assistants and Nurse Practitioners) who all work together to provide you with the care you need, when you need it.  We recommend signing up for the patient portal called "MyChart".  Sign up information is provided on this After Visit Summary.  MyChart is used to connect with patients for Virtual Visits (Telemedicine).  Patients are able to view lab/test results, encounter notes, upcoming appointments, etc.  Non-urgent messages can be sent to your provider as well.   To learn more about what you can do with MyChart, go to [email or web address].    Your next appointment:   4 week(s)  Provider:   Jenkins Rouge, MD

## 2023-02-02 NOTE — Progress Notes (Signed)
CARDIOLOGY CONSULT NOTE       Patient ID: Brent Shorten, MD MRN: [id number] DOB/AGE: Jan 13, 1966 57 y.o.  Admit date: (Not on file) Referring Physician: Self Primary Physician: Patient, [name] Per Primary Cardiologist: New Reason for Consultation: AFib  Active Problems:   * No active hospital problems. *   HPI:  57 y.o. self referred OB/GYN doctor her in town for atrial fibrillation. History of MS, prior laser uretal surgery for stones  He called my partner Dr Chauncey Cruel and indicated needing to be seen today for afib.    He has had a coronary calcium score of 188 which is 84 th percentile for age/sex done 03/14/21.  Read as primarily in LM but to my review in proximal LAD He has been on Crestor for his lipids Sees Dr Melford Aase at [place] also been on Copaxone for his "MS" and abnormal brain MRI   He is active playing basketball and running He thinks he goes in/out of afib more noticeable since August of last year. No bleeding issues or contraindications to blood thinner WBC was low on MS med Tecfidera but now better   ROS All other systems reviewed and negative except as noted above  Past Medical History:  Diagnosis Date   Hearing loss    Neuromuscular disorder (HCC)    lesions seen with MRI of brain    Family History  Problem Relation Age of Onset   Diabetes Father    Hypertension Father     Social History   Socioeconomic History   Marital status: Divorced    Spouse name: Not on file   Number of children: Not on file   Years of education: Not on file   Highest education level: Not on file  Occupational History   Not on file  Tobacco Use   Smoking status: Never   Smokeless tobacco: Not on file  Substance and Sexual Activity   Alcohol use: Yes    Comment: occasionally   Drug use: No   Sexual activity: Not on file  Other Topics Concern   Not on file  Social History Narrative   Not on file   Social Determinants of Health   Financial Resource Strain: Not on file   Food Insecurity: Not on file  Transportation Needs: Not on file  Physical Activity: Not on file  Stress: Not on file  Social Connections: Not on file  Intimate Partner Violence: Not on file    Past Surgical History:  Procedure Laterality Date   CYSTOSCOPY WITH RETROGRADE PYELOGRAM, URETEROSCOPY AND STENT PLACEMENT Left 06/01/2014   Procedure: [place], URETEROSCOPY;  Surgeon: Raynelle Bring, MD;  Location: [facility];  Service: Urology;  Laterality: Left;   FINGER SURGERY     HOLMIUM LASER APPLICATION Left [id number]   Procedure: HOLMIUM LASER APPLICATION;  Surgeon: Raynelle Bring, MD;  Location: [facility];  Service: Urology;  Laterality: Left;   INNER EAR SURGERY  implantable hearing aid-L   TONSILLECTOMY        Current Outpatient Medications:    diltiazem (CARDIZEM CD) 120 MG 24 hr capsule, Take 1 capsule (120 mg total) by mouth daily., Disp: 90 capsule, Rfl: 3   rivaroxaban (XARELTO) 20 MG TABS tablet, Take 1 tablet (20 mg total) by mouth daily with supper., Disp: 90 tablet, Rfl: 3    Physical Exam: There were no vitals taken for this visit.    Affect appropriate Healthy:  appears stated age 58: normal Neck supple with no adenopathy JVP  normal no bruits no thyromegaly Lungs clear with no wheezing and good diaphragmatic motion Heart:  S1/S2 no murmur, no rub, gallop or click PMI normal Abdomen: benighn, BS positve, no tenderness, no AAA no bruit.  No HSM or HJR Distal pulses intact with no bruits No edema Neuro non-focal Skin warm and dry No muscular weakness   Labs:   Lab Results  Component Value Date   WBC 15.3 (H) 12/15/2011   HGB 14.5 12/15/2011   HCT 42.6 12/15/2011   MCV 85.0 12/15/2011   PLT 210 12/15/2011   No results for input(s): "NA", "K", "CL", "CO2", "BUN", "CREATININE", "CALCIUM", "PROT", "BILITOT", "ALKPHOS", "ALT", "AST", "GLUCOSE" in the last 168 hours.  Invalid input(s): "LABALBU" No results found for: "CKTOTAL",  "CKMB", "CKMBINDEX", "TROPONINI" No results found for: "CHOL" No results found for: "HDL" No results found for: "LDLCALC" No results found for: "TRIG" No results found for: "CHOLHDL" No results found for: "LDLDIRECT"    Radiology: No results found.  EKG: brought by patient afib non specific ST changes    ASSESSMENT AND PLAN:   Afib:  ? Paroxysmal CHADVASC 0 but don't know if he is in chronic afib and will need [place] or paroxysmal and needs AAT. Given high calcium in LAD wouild not use flecainide TTE to assess EF and atrial sizes. 30 day monitor to assess chronicity Start Cardizem and Xarelto as he prefers once daily dosing Did discuss and explain the [facility] device to him as well as this would be useful to him after initial 30 day monitor  MS:  F/U [place] prior abnormal MRI on Copaxone Urology:  post stent Borden no recurrent stones   30 day monitor TTE Cardizem CD 120 mg  Xarelto 20 mg   F/U in 4 weeks after monitor available  Signed: Jenkins Rouge 02/02/2023, 1:15 PM

## 2023-02-21 ENCOUNTER — Ambulatory Visit: Payer: 59 | Attending: Cardiovascular Disease

## 2023-02-21 DIAGNOSIS — I4891 Unspecified atrial fibrillation: Secondary | ICD-10-CM

## 2023-03-01 ENCOUNTER — Ambulatory Visit (HOSPITAL_COMMUNITY): Payer: 59 | Attending: Cardiology

## 2023-03-01 DIAGNOSIS — I4891 Unspecified atrial fibrillation: Secondary | ICD-10-CM

## 2023-03-01 LAB — ECHOCARDIOGRAM COMPLETE
Area-P 1/2: 2.83 cm2
S' Lateral: 2.7 cm

## 2023-03-02 NOTE — Progress Notes (Signed)
CARDIOLOGY CONSULT NOTE       Patient ID: Brent Camp, MD MRN: [id number] DOB/AGE: 09-May-1966 57 y.o.  Admit date: (Not on file) Referring Physician: Self Primary Physician: Patient, [name] Per Primary Cardiologist: Eden Emms Reason for Consultation: AFib    HPI:  57 y.o. self referred OB/GYN doctor her in town for atrial fibrillation. History of MS, prior laser uretal surgery for stones  He called my partner Dr Roby Lofts and indicated needing to be seen 02/02/23  for afib.    He has had a coronary calcium score of 188 which is 84 th percentile for age/sex done 03/14/21.  Read as primarily in LM but to my review in proximal LAD He has been on Crestor for his lipids Sees Dr Cyndia Bent at [place] Has also been on Copaxone for his "MS" and abnormal brain MRI   He is active playing basketball and running He thinks he goes in/out of afib more noticeable since August of last year. No bleeding issues or contraindications to blood thinner WBC was low on MS med Tecfidera but now better  TTE 03/01/23 EF 60-65% NOrmal RV trivial MR otherwise normal LA 37 mm   Monitor ordered to see if chronic or paroxysmal Started on cardizem and xarelto Monitor with PAF infrequent   He did not note episode of PAF Has not gotten monitor Needs to check lipid/liver at his office Suspect he will need to go up on his crestor to hit target LDL of 70    ROS All other systems reviewed and negative except as noted above  Past Medical History:  Diagnosis Date   Hearing loss    Neuromuscular disorder    lesions seen with MRI of brain    Family History  Problem Relation Age of Onset   Diabetes Father    Hypertension Father     Social History   Socioeconomic History   Marital status: Divorced    Spouse name: Not on file   Number of children: Not on file   Years of education: Not on file   Highest education level: Not on file  Occupational History   Not on file  Tobacco Use   Smoking status: Never   Smokeless  tobacco: Not on file  Substance and Sexual Activity   Alcohol use: Yes    Comment: occasionally   Drug use: No   Sexual activity: Not on file  Other Topics Concern   Not on file  Social History Narrative   Not on file   Social Determinants of Health   Financial Resource Strain: Not on file  Food Insecurity: Not on file  Transportation Needs: Not on file  Physical Activity: Not on file  Stress: Not on file  Social Connections: Not on file  Intimate Partner Violence: Not on file    Past Surgical History:  Procedure Laterality Date   CYSTOSCOPY WITH RETROGRADE PYELOGRAM, URETEROSCOPY AND STENT PLACEMENT Left 06/01/2014   Procedure: CYSTOSCOPY WITH RETROGRADE PYELOGRAM, URETEROSCOPY;  Surgeon: Heloise Purpura, MD;  Location: [facility];  Service: Urology;  Laterality: Left;   FINGER SURGERY     HOLMIUM LASER APPLICATION Left 06/01/2014   Procedure: HOLMIUM LASER APPLICATION;  Surgeon: Heloise Purpura, MD;  Location: [facility];  Service: Urology;  Laterality: Left;   INNER EAR SURGERY  implantable hearing aid-L   TONSILLECTOMY        Current Outpatient Medications:    diltiazem (CARDIZEM CD) 120 MG 24 hr capsule, Take 1 capsule (120 mg total) by mouth daily.,  Disp: 90 capsule, Rfl: 3   Glatiramer Acetate (COPAXONE) 40 MG/ML SOSY, Inject 40 mg into the skin 3 (three) times a week., Disp: , Rfl:    rivaroxaban (XARELTO) 20 MG TABS tablet, Take 1 tablet (20 mg total) by mouth daily with supper., Disp: 90 tablet, Rfl: 3   rosuvastatin (CRESTOR) 20 MG tablet, Take 20 mg by mouth daily., Disp: , Rfl:     Physical Exam: Blood pressure 122/78, pulse (!) 56, height 6\' 1"  (1.854 m), weight 180 lb 12.8 oz (82 kg), SpO2 96 %.    Affect appropriate Healthy:  appears stated age HEENT: normal Neck supple with no adenopathy JVP normal no bruits no thyromegaly Lungs clear with no wheezing and good diaphragmatic motion Heart:  S1/S2 no murmur, no rub, gallop or click PMI normal Abdomen: benighn, BS  positve, no tenderness, no AAA no bruit.  No HSM or HJR Distal pulses intact with no bruits No edema Neuro non-focal Skin warm and dry No muscular weakness   Labs:   Lab Results  Component Value Date   WBC 15.3 (H) 12/15/2011   HGB 14.5 12/15/2011   HCT 42.6 12/15/2011   MCV 85.0 12/15/2011   PLT 210 12/15/2011   No results for input(s): "NA", "K", "CL", "CO2", "BUN", "CREATININE", "CALCIUM", "PROT", "BILITOT", "ALKPHOS", "ALT", "AST", "GLUCOSE" in the last 168 hours.  Invalid input(s): "LABALBU" No results found for: "CKTOTAL", "CKMB", "CKMBINDEX", "TROPONINI" No results found for: "CHOL" No results found for: "HDL" No results found for: "LDLCALC" No results found for: "TRIG" No results found for: "CHOLHDL" No results found for: "LDLDIRECT"    Radiology: ECHOCARDIOGRAM COMPLETE  Result Date: 03/01/2023    ECHOCARDIOGRAM REPORT   Patient Name:   [name] Date of Exam: 03/01/2023 Medical Rec #:  [id number]      Height:       73.0 in Accession #:    [id number]     Weight:       180.0 lb Date of Birth:  05-26-1966       BSA:          2.057 m Patient Age:    56 years       BP:           110/85 mmHg Patient Gender: M              HR:           64 bpm. Exam Location:  [address] Procedure: 2D Echo, 3D Echo, Cardiac Doppler, Color Doppler and Strain Analysis Indications:    I48.91* Unspecified atrial fibrillation  History:        Patient has no prior history of Echocardiogram examinations.                 Multiple sclerosis.  Sonographer:    Cathie Beams RCS Referring Phys: [id number]ade IMPRESSIONS  1. Left ventricular ejection fraction, by estimation, is 60 to 65%. Left ventricular ejection fraction by 3D volume is 60 %. The left ventricle has normal function. The left ventricle has no regional wall motion abnormalities. Left ventricular diastolic  parameters were normal. The average left ventricular global longitudinal strain is -20.5 %.  2. Right ventricular systolic  function is normal. The right ventricular size is normal. There is normal pulmonary artery systolic pressure. The estimated right ventricular systolic pressure is 19.2 mmHg.  3. The mitral valve is normal in structure. Trivial mitral valve regurgitation. No evidence of mitral stenosis.  4. The  aortic valve is tricuspid. Aortic valve regurgitation is not visualized. No aortic stenosis is present.  5. The inferior vena cava is normal in size with greater than 50% respiratory variability, suggesting right atrial pressure of 3 mmHg. FINDINGS  Left Ventricle: Left ventricular ejection fraction, by estimation, is 60 to 65%. Left ventricular ejection fraction by 3D volume is 60 %. The left ventricle has normal function. The left ventricle has no regional wall motion abnormalities. The average left ventricular global longitudinal strain is -20.5 %. The left ventricular internal cavity size was normal in size. There is no left ventricular hypertrophy. Left ventricular diastolic parameters were normal. Right Ventricle: The right ventricular size is normal. No increase in right ventricular wall thickness. Right ventricular systolic function is normal. There is normal pulmonary artery systolic pressure. The tricuspid regurgitant velocity is 2.01 m/s, and  with an assumed right atrial pressure of 3 mmHg, the estimated right ventricular systolic pressure is 19.2 mmHg. Left Atrium: Left atrial size was normal in size. Right Atrium: Right atrial size was normal in size. Pericardium: There is no evidence of pericardial effusion. Mitral Valve: The mitral valve is normal in structure. Trivial mitral valve regurgitation. No evidence of mitral valve stenosis. Tricuspid Valve: The tricuspid valve is normal in structure. Tricuspid valve regurgitation is trivial. Aortic Valve: The aortic valve is tricuspid. Aortic valve regurgitation is not visualized. No aortic stenosis is present. Pulmonic Valve: The pulmonic valve was not well  visualized. Pulmonic valve regurgitation is not visualized. Aorta: The aortic root and ascending aorta are structurally normal, with no evidence of dilitation. Venous: The inferior vena cava is normal in size with greater than 50% respiratory variability, suggesting right atrial pressure of 3 mmHg. IAS/Shunts: No atrial level shunt detected by color flow Doppler.  LEFT VENTRICLE PLAX 2D LVIDd:         4.50 cm         Diastology LVIDs:         2.70 cm         LV e' medial:    7.40 cm/s LV PW:         1.00 cm         LV E/e' medial:  8.2 LV IVS:        1.00 cm         LV e' lateral:   14.60 cm/s LVOT diam:     2.00 cm         LV E/e' lateral: 4.2 LV SV:         73 LV SV Index:   35              2D LVOT Area:     3.14 cm        Longitudinal                                Strain                                2D Strain GLS  -20.5 %                                Avg:  3D Volume EF                                LV 3D EF:    Left                                             ventricul                                             ar                                             ejection                                             fraction                                             by 3D                                             volume is                                             60 %.                                 3D Volume EF:                                3D EF:        60 %                                LV EDV:       221 ml                                LV ESV:       88 ml                                LV SV:        133 ml RIGHT VENTRICLE RV Basal diam:  2.80 cm RV Mid diam:    2.90 cm RV S prime:     11.90 cm/s TAPSE (M-mode): 2.1 cm RVSP:  19.2 mmHg LEFT ATRIUM             Index        RIGHT ATRIUM           Index LA diam:        3.70 cm 1.80 cm/m   RA Pressure: 3.00 mmHg LA Vol (A2C):   56.4 ml 27.41 ml/m  RA Area:     11.60 cm LA Vol (A4C):   31.7 ml 15.41 ml/m  RA  Volume:   23.50 ml  11.42 ml/m LA Biplane Vol: 46.7 ml 22.70 ml/m  AORTIC VALVE LVOT Vmax:   103.00 cm/s LVOT Vmean:  66.600 cm/s LVOT VTI:    0.231 m  AORTA Ao Root diam: 3.20 cm Ao Asc diam:  3.50 cm MITRAL VALVE               TRICUSPID VALVE MV Area (PHT): 2.83 cm    TR Peak grad:   16.2 mmHg MV Decel Time: 268 msec    TR Vmax:        201.00 cm/s MV E velocity: 60.70 cm/s  Estimated RAP:  3.00 mmHg MV A velocity: 55.90 cm/s  RVSP:           19.2 mmHg MV E/A ratio:  1.09                            SHUNTS                            Systemic VTI:  0.23 m                            Systemic Diam: 2.00 cm Epifanio Lesches MD Electronically signed by Epifanio Lesches MD Signature Date/Time: 03/01/2023/10:38:36 AM    Final     EKG: brought by patient afib non specific ST changes 03/12/2023 SR rate 56 normal PR 222 msec    ASSESSMENT AND PLAN:   Afib:  ? Paroxysmal CHADVASC 0 but don't know if he is in chronic afib and will need DCC or paroxysmal and needs AAT. Given high calcium in LAD wouild not use flecainide Continue xarelto and cardizem TTE benign If he has more frequent and sustained episodes will refer to EP for ablatiopn   MS:  F/U Willis prior abnormal MRI on Copaxone Urology:  post stent Borden no recurrent stones    F/U in a year   Signed: Charlton Haws 03/12/2023, 8:36 AM

## 2023-03-03 ENCOUNTER — Telehealth: Payer: Self-pay | Admitting: Cardiovascular Disease

## 2023-03-03 NOTE — Telephone Encounter (Signed)
Pt returning nurses call regarding Echo results. Pt states to leave a message if no answer when calling back due to him being in the O.R. Please advise

## 2023-03-03 NOTE — Telephone Encounter (Signed)
Per Dr. Johnsie Cancel, Normal echo with good EF and no significant valvular heart disease. Left detailed message of results on patient's voicemail.

## 2023-03-12 ENCOUNTER — Ambulatory Visit: Payer: 59 | Attending: Cardiovascular Disease | Admitting: Cardiovascular Disease

## 2023-03-12 ENCOUNTER — Encounter: Payer: Self-pay | Admitting: Cardiovascular Disease

## 2023-03-12 VITALS — BP 122/78 | HR 56 | Ht 73.0 in | Wt 180.8 lb

## 2023-03-12 DIAGNOSIS — I4891 Unspecified atrial fibrillation: Secondary | ICD-10-CM | POA: Diagnosis not present

## 2023-03-12 NOTE — Patient Instructions (Signed)
Medication Instructions:  Your physician recommends that you continue on your current medications as directed. Please refer to the Current Medication list given to you today.  *If you need a refill on your cardiac medications before your next appointment, please call your pharmacy*   Lab Work: NONE If you have labs (blood work) drawn today and your tests are completely normal, you will receive your results only by: MyChart Message (if you have MyChart) OR A paper copy in the mail If you have any lab test that is abnormal or we need to change your treatment, we will call you to review the results.   Testing/Procedures: NONE   Follow-Up: At [facility], you and your health needs are our priority.  As part of our continuing mission to provide you with exceptional heart care, we have created designated Provider Care Teams.  These Care Teams include your primary Cardiologist (physician) and Advanced Practice Providers (APPs -  Physician Assistants and Nurse Practitioners) who all work together to provide you with the care you need, when you need it.  We recommend signing up for the patient portal called "MyChart".  Sign up information is provided on this After Visit Summary.  MyChart is used to connect with patients for Virtual Visits (Telemedicine).  Patients are able to view lab/test results, encounter notes, upcoming appointments, etc.  Non-urgent messages can be sent to your provider as well.   To learn more about what you can do with MyChart, go to [email or web address].    Your next appointment:   1 year(s)  Provider:   Peter Nishan, MD     

## 2023-06-27 NOTE — Progress Notes (Unsigned)
[facility]  PATIENT: Brent Camp, Brent Jones DOB: [birth year]  REFERRING DOCTOR OR PCP: Antony Haste, Brent Jones SOURCE: Patient, notes from Dr. Harlen Labs, imaging and lab reports, MRI images personally reviewed.  _________________________________   HISTORICAL  CHIEF COMPLAINT:  No chief complaint on file.   HISTORY OF PRESENT ILLNESS:  I had the pleasure of seeing your patient, Brent Jones, at [facility] for neurologic consultation regarding his abnormal brain MRI and diagnosis of multiple sclerosis.  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   At that time he had noted that the eyes were crossed, after a long day of work, though he did not note double vision.  He saw ophthalmology who referred him for brain MRI that showed multiple T2/FLAIR hyperintense foci that were nonspecific but suggestive of demyelination or chronic microvascular ischemic change.  Ultimately, the visual findings were felt to be due to esotropia.  He saw Brent Mayhew, Brent Jones at [facility] who performed a lumbar puncture. This was reportedly normal with no oligoclonal bands in his CSF. She suggested the changes on brain MRI be monitored. He started a baby Aspirin. He remained well for many years.   He started to see Brent Jones in [place].  Due to congenital hearing loss, he had a hearing aid implanted in 2009 that was not MRI compatible.  Before that, he had a lumbar puncture that did not show oligoclonal bands.    In 2012, he noted when he played basketball in the winter, his fingers are very cold and his dexterity is affected.  He only noticed one episode during which a neurological symptom was exacerbated by physical activity and that was when he was on the stair climber at the gym - his toes on one Jones became numb.   In addition to the abnormal sensations in his extremities, he feels that his legs are heavy. He has continued to be very active but he  feels that he is unable to do as much as he once was. He does not notice true weakness, Jones drop, increased falls, or difficulty with balance. .   Following his visit in January 2015, a vascular study was done for evaluation of his feeling of coldness in his extremities which was normal. 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   Because he has the white matter changes on brain MRI and because he is experiencing symptoms which Brent Jones felt could be related to a demyelinating disease, he decided to begin disease modifying therapy. He started with Avonex. He could not tolerate it so he switched to Tecfidera. He has been taking this medication since January 2014.  He started to see Brent Jones in 2015, The dose was lowered in 2018 due to low lymphocyte counts and the switch from Tecfidera to Copaxone in 2020.     He is physically active and has had some shoulder injuries and has a supraspinatus tar on the right.  He also has pain in the left ip that is currently mild.  Sometimes pain radiates down tthe left leg.  This happens more frequently with sitting on a hard surface (piano stool).     Vascular risk factors:   He denies HTN, DM or tobacco use.     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  He notes cold hands/feet, especially in colder weather.  His hands  sometimes tur blue.  Viagra helps though does not take much.     He has no problems with a hot tub.     IMAGING MRI of the brain 08/24/2018 showed multiple T2/FLAIR hyperintense foci in the cerebral hemispheres.  Most of the foci are nonspecific predominantly in the subcortical and deep white matter.  There are just a couple periventricular foci.  There are no T2/FLAIR hyperintense foci in the infratentorial white matter.  Metal artifact from an implanted hearing device is noted on the left.  OCT 2020 showed  REVIEW OF SYSTEMS: Constitutional: No  fevers, chills, sweats, or change in appetite Eyes: No visual changes, double vision, eye pain Ear, nose and throat: No hearing loss, ear pain, nasal congestion, sore throat Cardiovascular: No chest pain, palpitations Respiratory:  No shortness of breath at rest or with exertion.   No wheezes GastrointestinaI: No nausea, vomiting, diarrhea, abdominal pain, fecal incontinence Genitourinary:  No dysuria, urinary retention or frequency.  No nocturia. Musculoskeletal:  No neck pain, back pain Integumentary: No rash, pruritus, skin lesions Neurological: as above Psychiatric: No depression at this time.  No anxiety Endocrine: No palpitations, diaphoresis, change in appetite, change in weigh or increased thirst Hematologic/Lymphatic:  No anemia, purpura, petechiae. Allergic/Immunologic: No itchy/runny eyes, nasal congestion, recent allergic reactions, rashes  ALLERGIES: No Known Allergies  HOME MEDICATIONS:  Current Outpatient Medications:    diltiazem (CARDIZEM CD) 120 MG 24 hr capsule, Take 1 capsule (120 mg total) by mouth daily., Disp: 90 capsule, Rfl: 3   Glatiramer Acetate (COPAXONE) 40 MG/ML SOSY, Inject 40 mg into the skin 3 (three) times a week., Disp: , Rfl:    rivaroxaban (XARELTO) 20 MG TABS tablet, Take 1 tablet (20 mg total) by mouth daily with supper., Disp: 90 tablet, Rfl: 3   rosuvastatin (CRESTOR) 20 MG tablet, Take 20 mg by mouth daily., Disp: , Rfl:   PAST MEDICAL HISTORY: Past Medical History:  Diagnosis Date   Hearing loss    Neuromuscular disorder (HCC)    lesions seen with MRI of brain    PAST SURGICAL HISTORY: Past Surgical History:  Procedure Laterality Date   CYSTOSCOPY WITH RETROGRADE PYELOGRAM, URETEROSCOPY AND STENT PLACEMENT Left 06/01/2014   Procedure: CYSTOSCOPY WITH RETROGRADE PYELOGRAM, URETEROSCOPY;  Surgeon: Heloise Purpura, Brent Jones;  Location: [facility];  Service: Urology;  Laterality: Left;   FINGER SURGERY     HOLMIUM LASER APPLICATION Left 06/01/2014    Procedure: HOLMIUM LASER APPLICATION;  Surgeon: Heloise Purpura, Brent Jones;  Location: [facility];  Service: Urology;  Laterality: Left;   INNER EAR SURGERY  implantable hearing aid-L   TONSILLECTOMY      FAMILY HISTORY: Family History  Problem Relation Age of Onset   Diabetes Father    Hypertension Father     SOCIAL HISTORY: Social History   Socioeconomic History   Marital status: Divorced    Spouse name: Not on file   Number of children: Not on file   Years of education: Not on file   Highest education level: Not on file  Occupational History   Not on file  Tobacco Use   Smoking status: Never   Smokeless tobacco: Not on file  Substance and Sexual Activity   Alcohol use: Yes    Comment: occasionally   Drug use: No   Sexual activity: Not on file  Other Topics Concern   Not on file  Social History Narrative   Not on file   Social Determinants of Health   Financial Resource Strain:  Low Risk  (03/25/2022)   Received from [facility]   Overall Financial Resource Strain (CARDIA)    Difficulty of Paying Living Expenses: Not hard at all  Food Insecurity: No Food Insecurity (03/25/2022)   Received from [facility]   Hunger Vital Sign    Worried About Running Out of Food in the Last Year: Never true    Ran Out of Food in the Last Year: Never true  Transportation Needs: Not on file  Physical Activity: Sufficiently Active (03/25/2022)   Received from [facility]   Exercise Vital Sign    Days of Exercise per Week: 5 days    Minutes of Exercise per Session: 30 min  Stress: No Stress Concern Present (03/25/2022)   Received from [facility] - Occupational Stress Questionnaire    Feeling of Stress : Not at all  Social Connections: Unknown (03/27/2023)   Received from [facility]   Social Network    Social Network: Not on file  Intimate Partner Violence: Unknown (03/27/2023)   Received from [facility]   HITS    Physically Hurt: Not  on file    Insult or Talk Down To: Not on file    Threaten Physical Harm: Not on file    Scream or Curse: Not on file       PHYSICAL EXAM  There were no vitals filed for this visit.  There is no height or weight on file to calculate BMI.   General: The patient is well-developed and well-nourished and in no acute distress  HEENT:  Head is /AT.  Sclera are anicteric.  Funduscopic exam shows normal optic discs and retinal vessels.  Neck: No carotid bruits are noted.  The neck is nontender.  Cardiovascular: The heart has a regular rate and rhythm with a normal S1 and S2. There were no murmurs, gallops or rubs.    Skin: Extremities are without rash or  edema.  Musculoskeletal:  Back is nontender.  No tenderness over trochanteric bursa, piriformis muscle.  He does have some tenderness over the left SI joint.  Neurologic Exam  Mental status: The patient is alert and oriented x 3 at the time of the examination. The patient has apparent normal recent and remote memory, with an apparently normal attention span and concentration ability.   Speech is normal.  Cranial nerves: Extraocular movements are full. Pupils are equal, round, and reactive to light and accomodation.  Visual fields are full.  Facial symmetry is present. There is good facial sensation to soft touch bilaterally.Facial strength is normal.  Trapezius and sternocleidomastoid strength is normal. No dysarthria is noted.  The tongue is midline, and the patient has symmetric elevation of the soft palate. No obvious hearing deficits are noted.  Motor:  Muscle bulk is normal.   Tone is normal. Strength is  5 / 5 in all 4 extremities.   Sensory: Sensory testing is intact to pinprick, soft touch and vibration sensation in all 4 extremities.  Coordination: Cerebellar testing reveals good finger-nose-finger and heel-to-shin bilaterally.  Gait and station: Station is normal.   Gait is normal. Tandem gait is normal. Romberg is  negative.   Reflexes: Deep tendon reflexes are symmetric and normal bilaterally.   Plantar responses are flexor.    DIAGNOSTIC DATA (LABS, IMAGING, TESTING) - I reviewed patient records, labs, notes, testing and imaging myself where available.  Lab Results  Component Value Date   WBC 15.3 (H) 12/15/2011   HGB  14.5 12/15/2011   HCT 42.6 12/15/2011   MCV 85.0 12/15/2011   PLT 210 12/15/2011      Component Value Date/Time   NA 136 (L) 06/01/2014 1605   K 4.2 06/01/2014 1605   CL 98 06/01/2014 1605   CO2 23 06/01/2014 1605   GLUCOSE 90 06/01/2014 1605   BUN 23 06/01/2014 1605   CREATININE 1.55 (H) 06/01/2014 1605   CALCIUM 9.9 06/01/2014 1605   PROT 7.9 12/15/2011 1051   ALBUMIN 4.7 12/15/2011 1051   AST 29 12/15/2011 1051   ALT 25 12/15/2011 1051   ALKPHOS 72 12/15/2011 1051   BILITOT 0.9 12/15/2011 1051   GFRNONAA 51 (L) 06/01/2014 1605   GFRAA 59 (L) 06/01/2014 1605       ASSESSMENT AND PLAN  White matter abnormality on MRI of brain - Plan: ANA w/Reflex, Rheumatoid factor, Sedimentation rate, C-reactive protein, ANCA Profile, Homocysteine, MR BRAIN W WO CONTRAST  Raynaud's phenomenon without gangrene - Plan: ANA w/Reflex, Rheumatoid factor, Sedimentation rate, C-reactive protein, ANCA Profile  Arthralgia, unspecified joint - Plan: ANA w/Reflex, Rheumatoid factor, Sedimentation rate, C-reactive protein, ANCA Profile  Multiple sclerosis (HCC) - Plan: MR BRAIN W WO CONTRAST  Chronic left-sided low back pain with left-sided sciatica - Plan: MR Lumbar Spine W Wo Contrast  Left hip pain - Plan: MR Lumbar Spine W Wo Contrast, DG HIP UNILAT WITH PELVIS 2-3 VIEWS LEFT  Sacroiliac joint dysfunction of left side - Plan: DG HIP UNILAT WITH PELVIS 2-3 VIEWS LEFT   In summary, Brent Jones is a 57 year old man who was diagnosed with an MS around 2005 after presenting with diplopia and was found to have an abnormal brain MRI with multiple T2/FLAIR hyperintense foci in the  cerebral hemispheres.  He was diagnosed with presumptive MS right before he had an implanted hearing aid on the left that was contraindicated for use an MRI.  He has been on Copaxone for the past several years and notes no new symptoms.  His exam is neurologically intact.  I discussed with him that although MS is his presumptive diagnosis, the appearance of the lesions on MRI are not typical and the lumbar puncture showed normal CSF.  Therefore, I think there is a good possibility that he does not have MS.  We will check an MRI of the brain to determine if there has been further progression and consider discontinuing the termer based on the results.  He does not have any significant vascular risk factors.  Second problem is musculoskeletal pain, especially in the hips.  He also has some back pain with occasional radiating pain down the leg.  Symptoms have persisted despite therapy and exercises directed towards the lower back and piriformis muscles.  Therefore, we will check an MRI of the lumbar spine to further evaluate to determine if there is evidence of nerve root compression that might best be treated with injection or surgery.  Additionally, I will check x-ray of the left hip.  Based on results he may be referred to orthopedics.  He also has possible Raynaud's syndrome.  He has never had an autoimmune evaluation we will check some lab work to determine if there is evidence of this.  He will return to see me in 6 months or sooner if there are new or worsening neurologic symptoms.  Thank you for asking me to see Brent Jones.  Please let me know if I can be of further assistance with him or other patients in the future.  Brent Jurgens A. Epimenio Foot, Brent Jones, [facility] 06/27/2023, 6:36 PM Certified in Neurology, Clinical Neurophysiology, Sleep Medicine and Neuroimaging  [facility] [address], [address] [place], [place] [address] [phone]

## 2023-06-30 ENCOUNTER — Encounter: Payer: Self-pay | Admitting: Neurology

## 2023-06-30 ENCOUNTER — Ambulatory Visit (INDEPENDENT_AMBULATORY_CARE_PROVIDER_SITE_OTHER): Payer: 59 | Admitting: Neurology

## 2023-06-30 ENCOUNTER — Telehealth: Payer: Self-pay | Admitting: Neurology

## 2023-06-30 VITALS — BP 136/85 | HR 70 | Ht 73.0 in | Wt 180.0 lb

## 2023-06-30 DIAGNOSIS — M25552 Pain in left hip: Secondary | ICD-10-CM

## 2023-06-30 DIAGNOSIS — I7300 Raynaud's syndrome without gangrene: Secondary | ICD-10-CM

## 2023-06-30 DIAGNOSIS — M533 Sacrococcygeal disorders, not elsewhere classified: Secondary | ICD-10-CM

## 2023-06-30 DIAGNOSIS — G8929 Other chronic pain: Secondary | ICD-10-CM

## 2023-06-30 DIAGNOSIS — M2550 Pain in unspecified joint: Secondary | ICD-10-CM

## 2023-06-30 DIAGNOSIS — G35 Multiple sclerosis: Secondary | ICD-10-CM | POA: Diagnosis not present

## 2023-06-30 DIAGNOSIS — M5442 Lumbago with sciatica, left side: Secondary | ICD-10-CM

## 2023-06-30 DIAGNOSIS — R9082 White matter disease, unspecified: Secondary | ICD-10-CM

## 2023-06-30 NOTE — Telephone Encounter (Signed)
[employer] no auth required Case Number: [id number] sent to [facility] [id number]

## 2023-07-01 LAB — ANA W/REFLEX: Anti Nuclear Antibody (ANA): NEGATIVE

## 2023-07-01 LAB — ANCA PROFILE
C-ANCA: <1:20 {titer}
P-ANCA: <1:20 {titer}

## 2023-07-01 LAB — HOMOCYSTEINE: Homocysteine: 10.2 umol/L (ref 0.0–14.5)

## 2023-07-06 ENCOUNTER — Ambulatory Visit (HOSPITAL_COMMUNITY)
Admission: RE | Admit: 2023-07-06 | Discharge: 2023-07-06 | Disposition: A | Discharge status: Home / Self Care | Payer: 59 | Source: Ambulatory Visit | Attending: Neurology | Admitting: Neurology

## 2023-07-06 DIAGNOSIS — G35 Multiple sclerosis: Secondary | ICD-10-CM | POA: Diagnosis present

## 2023-07-06 DIAGNOSIS — R9082 White matter disease, unspecified: Secondary | ICD-10-CM | POA: Insufficient documentation

## 2023-07-06 MED ORDER — GADOBUTROL 1 MMOL/ML IV SOLN
8.0000 mL | Freq: Once | INTRAVENOUS | Status: AC | PRN
  Administered 2023-07-06: 8 mL via INTRAVENOUS

## 2023-07-08 ENCOUNTER — Ambulatory Visit (HOSPITAL_COMMUNITY)
Admission: RE | Admit: 2023-07-08 | Discharge: 2023-07-08 | Disposition: A | Discharge status: Home / Self Care | Payer: 59 | Source: Ambulatory Visit | Attending: Neurology | Admitting: Neurology

## 2023-07-08 DIAGNOSIS — M25552 Pain in left hip: Secondary | ICD-10-CM

## 2023-07-08 DIAGNOSIS — M5442 Lumbago with sciatica, left side: Secondary | ICD-10-CM | POA: Insufficient documentation

## 2023-07-08 DIAGNOSIS — M533 Sacrococcygeal disorders, not elsewhere classified: Secondary | ICD-10-CM | POA: Diagnosis present

## 2023-07-08 DIAGNOSIS — G8929 Other chronic pain: Secondary | ICD-10-CM | POA: Diagnosis present

## 2023-07-08 MED ORDER — GADOBUTROL 1 MMOL/ML IV SOLN
8.0000 mL | Freq: Once | INTRAVENOUS | Status: AC | PRN
  Administered 2023-07-08: 8 mL via INTRAVENOUS

## 2023-07-19 ENCOUNTER — Telehealth: Payer: Self-pay | Admitting: Neurology

## 2023-07-19 DIAGNOSIS — M5417 Radiculopathy, lumbosacral region: Secondary | ICD-10-CM

## 2023-07-19 NOTE — Telephone Encounter (Signed)
I discussed the MRI results with Dr. Rana Snare.  The MRI of the brain was essentially unchanged from 2019 showing foci that are more consistent with chronic microvascular ischemic change rather than demyelination.  We could consider stopping the Copaxone.  The MRI of the lumbar spine shows multilevel degenerative changes.  At L3-L4, degenerative changes are more to the right and could affect the right L3 and L4 nerve root.  At L5-S1, there is severe foraminal narrowing on the left likely affecting the left L5 nerve root.  We discussed referral for an epidural steroid injection and to consider referral to surgery if he does not get a benefit from [facility].

## 2023-09-22 ENCOUNTER — Encounter: Payer: Self-pay | Admitting: Neurology

## 2023-09-26 NOTE — Progress Notes (Unsigned)
[facility]  PATIENT: Brent Camp, MD DOB: 15-Aug-1966  REFERRING DOCTOR OR PCP: Brent Haste, MD SOURCE: Patient, notes from Brent Jones, imaging and lab reports, MRI images personally reviewed.  _________________________________   HISTORICAL  CHIEF COMPLAINT:  No chief complaint on file.   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  Update 09/27/2023: Since mid September 2024, he has had progressively worse right hand and arm numbness.  Since the last visit, he has had MRI of the brain July 2024.  I compared it side-by-side to the previous MRI from 2019 and there are no new lesions.  He stopped the Copaxone after that MRI.   In addition to the abnormal sensations in his extremities, he feels that his legs are heavy. He has continued to be very active but he feels that he is unable to do as much as he once was. He does not notice true weakness, foot drop, increased falls, or difficulty with balance. Marland Kitchen    He is physically active and has had some shoulder injuries and has a supraspinatus tar on the right.  He also has pain in the left ip that is currently mild.  Sometimes pain radiates down tthe left leg.  This happens more frequently with sitting on a hard surface (piano stool).      He notes cold hands/feet, especially in colder weather.  His hands sometimes tur blue.  Viagra helps though does not take much.     He has no problems with a hot tub.      Abnormal brain MRI/MS history: He was diagnosed with MS around 2005.   At that time he had noted that the eyes were crossed, after a long day of work, though he did not note double vision.  He saw ophthalmology who referred him for brain MRI that showed multiple T2/FLAIR hyperintense foci that were nonspecific but suggestive of demyelination or chronic microvascular ischemic change.  Ultimately, the  visual findings were felt to be due to esotropia.  He saw Brent Mayhew, MD at [facility] who performed a lumbar puncture. This was reportedly normal with no oligoclonal bands in his CSF. She suggested the changes on brain MRI be monitored. He started a baby Aspirin. He remained well for many years.   He started to see Dr. [name] in [place].  Due to congenital hearing loss, he had a hearing aid implanted in 2009 that was not MRI compatible.  Before that, he had a lumbar puncture that did not show oligoclonal bands.    In 2012, he noted when he played basketball in the winter, his fingers are very cold and his dexterity is affected.  He only noticed one episode during which a neurological symptom was exacerbated by physical activity and that was when he was on the stair climber at the gym - his toes on one foot became numb.   In January 2015, a vascular study was done for evaluation of his feeling of coldness in his extremities which was normal. 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   Because he has the white matter changes on brain MRI and because he was experiencing symptoms which Dr. [name] felt could be related to a demyelinating disease, he decided to begin disease modifying therapy. He started with Avonex. He could not  tolerate it so he switched to Tecfidera. He has been taking this medication since January 2014.  He started to see Dr. [name] in 2015, The dose was lowered in 2018 due to low lymphocyte counts and the switch from Tecfidera to Copaxone in 2020.    Vascular risk factors:   He denies HTN, DM or tobacco use.     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  IMAGING MRI of the brain 08/24/2018 showed multiple T2/FLAIR hyperintense foci in the cerebral hemispheres.  Most of the foci are nonspecific predominantly in the subcortical and deep white matter.  There are just a couple  periventricular foci.  There are no T2/FLAIR hyperintense foci in the infratentorial white matter.  Metal artifact from an implanted hearing device is noted on the left.  M RI of the brain 07/06/2023 was unchanged compared to the MRI from 2019.  MRI of the lumbar spine 07/08/2023 showed multilevel degenerative changes.  At L5-S1 there is severe left neural foraminal narrowing which could affect the left L5 nerve root.  At L3-L4 there are degenerative changes more to the right that could affect both the right L3 and L4 nerve roots.  There was edema the endplates.   REVIEW OF SYSTEMS: Constitutional: No fevers, chills, sweats, or change in appetite Eyes: No visual changes, double vision, eye pain Ear, nose and throat: No hearing loss, ear pain, nasal congestion, sore throat Cardiovascular: No chest pain, palpitations Respiratory:  No shortness of breath at rest or with exertion.   No wheezes GastrointestinaI: No nausea, vomiting, diarrhea, abdominal pain, fecal incontinence Genitourinary:  No dysuria, urinary retention or frequency.  No nocturia. Musculoskeletal:  No neck pain, back pain Integumentary: No rash, pruritus, skin lesions Neurological: as above Psychiatric: No depression at this time.  No anxiety Endocrine: No palpitations, diaphoresis, change in appetite, change in weigh or increased thirst Hematologic/Lymphatic:  No anemia, purpura, petechiae. Allergic/Immunologic: No itchy/runny eyes, nasal congestion, recent allergic reactions, rashes  ALLERGIES: No Known Allergies  HOME MEDICATIONS:  Current Outpatient Medications:    diltiazem (CARDIZEM CD) 120 MG 24 hr capsule, Take 1 capsule (120 mg total) by mouth daily., Disp: 90 capsule, Rfl: 3   Glatiramer Acetate (COPAXONE) 40 MG/ML SOSY, Inject 40 mg into the skin 3 (three) times a week., Disp: , Rfl:    rosuvastatin (CRESTOR) 20 MG tablet, Take 20 mg by mouth daily., Disp: , Rfl:   PAST MEDICAL HISTORY: Past Medical History:   Diagnosis Date   Hearing loss    Neuromuscular disorder (HCC)    lesions seen with MRI of brain    PAST SURGICAL HISTORY: Past Surgical History:  Procedure Laterality Date   CYSTOSCOPY WITH RETROGRADE PYELOGRAM, URETEROSCOPY AND STENT PLACEMENT Left 06/01/2014   Procedure: CYSTOSCOPY WITH RETROGRADE PYELOGRAM, URETEROSCOPY;  Surgeon: Heloise Purpura, MD;  Location: [facility];  Service: Urology;  Laterality: Left;   FINGER SURGERY     HOLMIUM LASER APPLICATION Left 06/01/2014   Procedure: HOLMIUM LASER APPLICATION;  Surgeon: Heloise Purpura, MD;  Location: [facility];  Service: Urology;  Laterality: Left;   INNER EAR SURGERY  implantable hearing aid-L   TONSILLECTOMY      FAMILY HISTORY: Family History  Problem Relation Age of Onset   Diabetes Father    Hypertension Father     SOCIAL HISTORY: Social History   Socioeconomic History   Marital status: Divorced    Spouse name: Not on file   Number of children: 3   Years of  education: Not on file   Highest education level: Doctorate  Occupational History   Not on file  Tobacco Use   Smoking status: Never   Smokeless tobacco: Not on file  Vaping Use   Vaping status: Never Used  Substance and Sexual Activity   Alcohol use: Yes    Alcohol/week: 3.0 standard drinks of alcohol    Types: 3 Standard drinks or equivalent per week    Comment: occasionally   Drug use: No   Sexual activity: Yes    Birth control/protection: None  Other Topics Concern   Not on file  Social History Narrative   Not on file   Social Determinants of Health   Financial Resource Strain: Low Risk  (03/25/2022)   Received from [facility], [facility]   Overall Financial Resource Strain (CARDIA)    Difficulty of Paying Living Expenses: Not hard at all  Food Insecurity: No Food Insecurity (03/25/2022)   Received from [facility], [facility]   Hunger Vital Sign    Worried About Running Out of Food in the Last Year: Never true    Ran Out of Food in the  Last Year: Never true  Transportation Needs: Not on file  Physical Activity: Sufficiently Active (03/25/2022)   Received from [facility], [facility]   Exercise Vital Sign    Days of Exercise per Week: 5 days    Minutes of Exercise per Session: 30 min  Stress: No Stress Concern Present (03/25/2022)   Received from [facility], [facility] - Occupational Stress Questionnaire    Feeling of Stress : Not at all  Social Connections: Unknown (03/27/2023)   Received from [facility], [facility]   Social Network    Social Network: Not on file  Intimate Partner Violence: Unknown (03/27/2023)   Received from [facility], [facility]   HITS    Physically Hurt: Not on file    Insult or Talk Down To: Not on file    Threaten Physical Harm: Not on file    Scream or Curse: Not on file       PHYSICAL EXAM  There were no vitals filed for this visit.  There is no height or weight on file to calculate BMI.   General: The patient is well-developed and well-nourished and in no acute distress  HEENT:  Head is [place]/AT.  Sclera are anicteric.  Funduscopic exam shows normal optic discs and retinal vessels.  Neck: No carotid bruits are noted.  The neck is nontender.  Cardiovascular: The heart has a regular rate and rhythm with a normal S1 and S2. There were no murmurs, gallops or rubs.    Skin: Extremities are without rash or  edema.  Musculoskeletal:  Back is nontender.  No tenderness over trochanteric bursa, piriformis muscle.  He does have some tenderness over the left SI joint.  Neurologic Exam  Mental status: The patient is alert and oriented x 3 at the time of the examination. The patient has apparent normal recent and remote memory, with an apparently normal attention span and concentration ability.   Speech is normal.  Cranial nerves: Extraocular movements are full. Pupils are equal, round, and reactive to light and accomodation.   Visual fields are full.  Facial symmetry is present. There is good facial sensation to soft touch bilaterally.Facial strength is normal.  Trapezius and sternocleidomastoid strength is normal. No dysarthria is noted.  The tongue is midline, and the patient has symmetric  elevation of the soft palate. No obvious hearing deficits are noted.  Motor:  Muscle bulk is normal.   Tone is normal. Strength is  5 / 5 in all 4 extremities.   Sensory: Sensory testing is intact to pinprick, soft touch and vibration sensation in all 4 extremities.  Coordination: Cerebellar testing reveals good finger-nose-finger and heel-to-shin bilaterally.  Gait and station: Station is normal.   Gait is normal. Tandem gait is normal. Romberg is negative.   Reflexes: Deep tendon reflexes are symmetric and normal bilaterally.   Plantar responses are flexor.    DIAGNOSTIC DATA (Jones, IMAGING, TESTING) - I reviewed patient records, Jones, notes, testing and imaging myself where available.  Lab Results  Component Value Date   WBC 15.3 (H) 12/15/2011   HGB 14.5 12/15/2011   HCT 42.6 12/15/2011   MCV 85.0 12/15/2011   PLT 210 12/15/2011      Component Value Date/Time   NA 136 (L) 06/01/2014 1605   K 4.2 06/01/2014 1605   CL 98 06/01/2014 1605   CO2 23 06/01/2014 1605   GLUCOSE 90 06/01/2014 1605   BUN 23 06/01/2014 1605   CREATININE 1.55 (H) 06/01/2014 1605   CALCIUM 9.9 06/01/2014 1605   PROT 7.9 12/15/2011 1051   ALBUMIN 4.7 12/15/2011 1051   AST 29 12/15/2011 1051   ALT 25 12/15/2011 1051   ALKPHOS 72 12/15/2011 1051   BILITOT 0.9 12/15/2011 1051   GFRNONAA 51 (L) 06/01/2014 1605   GFRAA 59 (L) 06/01/2014 1605       ASSESSMENT AND PLAN  No diagnosis found.   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  He was diagnosed  with presumptive MS right before he had an implanted hearing aid on the left that was contraindicated for use an MRI.  He has been on Copaxone for the past several years and notes no new symptoms.  His exam is neurologically intact.  I discussed with him that although MS is his presumptive diagnosis, the appearance of the lesions on MRI are not typical and the lumbar puncture showed normal CSF.  Therefore, I think there is a good possibility that he does not have MS.  We will check an MRI of the brain to determine if there has been further progression and consider discontinuing the termer based on the results.  He does not have any significant vascular risk factors.  Second problem is musculoskeletal pain, especially in the hips.  He also has some back pain with occasional radiating pain down the leg.  Symptoms have persisted despite therapy and exercises directed towards the lower back and piriformis muscles.  Therefore, we will check an MRI of the lumbar spine to further evaluate to determine if there is evidence of nerve root compression that might best be treated with injection or surgery.  Additionally, I will check x-ray of the left hip.  Based on results he may be referred to orthopedics.  He also has possible Raynaud's syndrome.  He has never had an autoimmune evaluation we will check some lab work to determine if there is evidence of this.  He will return to see me in 6 months or sooner if there are new or worsening neurologic symptoms.  Thank you for asking me to see Dr. Rana Snare.  Please let me know if I can  be of further assistance with him or other patients in the future.   Tyshawn Ciullo A. Epimenio Foot, MD, [facility] 09/26/2023, 6:15 PM Certified in Neurology, Clinical Neurophysiology, Sleep Medicine and Neuroimaging  [facility] [address] [place], [place] [address] [phone]

## 2023-09-27 ENCOUNTER — Encounter: Payer: Self-pay | Admitting: Neurology

## 2023-09-27 ENCOUNTER — Ambulatory Visit (INDEPENDENT_AMBULATORY_CARE_PROVIDER_SITE_OTHER): Payer: 59 | Admitting: Neurology

## 2023-09-27 VITALS — BP 120/75 | HR 83 | Ht 73.0 in | Wt 184.2 lb

## 2023-09-27 DIAGNOSIS — R200 Anesthesia of skin: Secondary | ICD-10-CM

## 2023-09-27 DIAGNOSIS — R9082 White matter disease, unspecified: Secondary | ICD-10-CM

## 2023-09-27 DIAGNOSIS — M12811 Other specific arthropathies, not elsewhere classified, right shoulder: Secondary | ICD-10-CM

## 2023-09-27 DIAGNOSIS — M75101 Unspecified rotator cuff tear or rupture of right shoulder, not specified as traumatic: Secondary | ICD-10-CM

## 2023-09-27 DIAGNOSIS — R29898 Other symptoms and signs involving the musculoskeletal system: Secondary | ICD-10-CM

## 2023-09-29 ENCOUNTER — Telehealth: Payer: Self-pay | Admitting: Neurology

## 2023-09-29 NOTE — Telephone Encounter (Signed)
[facility] NPR Case Number: [id number] sent to [facility] [phone]

## 2023-10-13 ENCOUNTER — Ambulatory Visit (HOSPITAL_COMMUNITY)
Admission: RE | Admit: 2023-10-13 | Discharge: 2023-10-13 | Disposition: A | Discharge status: Home / Self Care | Payer: 59 | Source: Ambulatory Visit | Attending: Neurology | Admitting: Neurology

## 2023-10-13 DIAGNOSIS — R29898 Other symptoms and signs involving the musculoskeletal system: Secondary | ICD-10-CM | POA: Insufficient documentation

## 2023-10-13 DIAGNOSIS — R200 Anesthesia of skin: Secondary | ICD-10-CM | POA: Diagnosis present

## 2023-10-13 DIAGNOSIS — M12811 Other specific arthropathies, not elsewhere classified, right shoulder: Secondary | ICD-10-CM | POA: Diagnosis present

## 2023-10-13 DIAGNOSIS — M75101 Unspecified rotator cuff tear or rupture of right shoulder, not specified as traumatic: Secondary | ICD-10-CM | POA: Insufficient documentation

## 2023-10-25 ENCOUNTER — Encounter: Payer: Self-pay | Admitting: Cardiovascular Disease

## 2023-10-25 MED ORDER — DILTIAZEM HCL ER COATED BEADS 120 MG PO CP24: 120.0000 mg | ORAL_CAPSULE | Freq: Every day | ORAL | 3 refills | Status: DC

## 2023-10-25 NOTE — Telephone Encounter (Signed)
Placed order for refill.  

## 2023-11-22 ENCOUNTER — Other Ambulatory Visit: Payer: Self-pay | Admitting: Orthopedic Surgery

## 2023-11-22 DIAGNOSIS — M5417 Radiculopathy, lumbosacral region: Secondary | ICD-10-CM

## 2024-01-06 ENCOUNTER — Ambulatory Visit: Payer: 59 | Admitting: Neurology

## 2024-01-13 ENCOUNTER — Ambulatory Visit: Payer: 59 | Admitting: Neurology

## 2024-01-13 VITALS — BP 116/75 | HR 61 | Wt 186.2 lb

## 2024-01-13 DIAGNOSIS — G8929 Other chronic pain: Secondary | ICD-10-CM

## 2024-01-13 DIAGNOSIS — R29898 Other symptoms and signs involving the musculoskeletal system: Secondary | ICD-10-CM

## 2024-01-13 DIAGNOSIS — R200 Anesthesia of skin: Secondary | ICD-10-CM

## 2024-01-13 DIAGNOSIS — R9082 White matter disease, unspecified: Secondary | ICD-10-CM | POA: Diagnosis not present

## 2024-01-13 DIAGNOSIS — M5442 Lumbago with sciatica, left side: Secondary | ICD-10-CM

## 2024-01-13 NOTE — Progress Notes (Signed)
 [facility]  PATIENT: Brent Cook, Brent Jones DOB: 12/25/1965  REFERRING DOCTOR OR PCP: Ozell Reilly, Brent Jones SOURCE: Patient, notes from Dr. Damien hives, imaging and lab reports, MRI images personally reviewed.  _________________________________   HISTORICAL  CHIEF COMPLAINT:  Chief Complaint  Patient presents with   Follow-up    RM 10, alone. Last eye appt about a year and a half ago.     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  Update 09/27/2023: He has had right shoulder and neck pain and had MRIs.   Cervical spine images shows DJD that could affect the left C5 and C6 nerve roots and right C7 nerve root.   Also has shoulder DJD.   He saw Dr. Sheril of Ortho and Dr. Beuford.   Surgery was not recommended.    He also has left hip/leg pain and appears to have L5 nerve roor compression.   He is going to have a ESI   He denies   Abnormal brain MRI/MS history: He was diagnosed with MS around 2005.   At that time he had noted that the eyes were crossed, after a long day of work, though he did not note double vision.  He saw ophthalmology who referred him for brain MRI that showed multiple T2/FLAIR hyperintense foci that were nonspecific but suggestive of demyelination or chronic microvascular ischemic change.  Ultimately, the visual findings were felt to be due to esotropia.  He saw Dorothyann Done, Brent Jones at [facility] who performed a lumbar puncture. This was reportedly normal with no oligoclonal bands in his CSF. She suggested the changes on brain MRI be monitored. He started a baby Aspirin. He remained well for many years.   He started to see Dr. Maurice in [place].  Due to congenital hearing loss, he had a hearing aid implanted in 2009 that was not MRI compatible.  Before that, he had a lumbar puncture that did not show oligoclonal bands.    In 2012, he  noted when he played basketball in the winter, his fingers are very cold and his dexterity is affected.  He only noticed one episode during which a neurological symptom was exacerbated by physical activity and that was when he was on the stair climber at the gym - his toes on one foot became numb.   In January 2015, a vascular study was done for evaluation of his feeling of coldness in his extremities which was normal. 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   Because he has the white matter changes on brain MRI and because he was experiencing symptoms which Dr. Maurice felt could be related to a demyelinating disease, he decided to begin disease modifying therapy. He started with Avonex. He could not tolerate it so he switched to Tecfidera. He has been taking this medication since January 2014.  He started to see Dr. Hives in 2015, The dose was lowered in 2018 due to low lymphocyte counts and the switch from Tecfidera to Copaxone in 2020.    Vascular risk factors:   He denies HTN, DM or tobacco use.     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  IMAGING MRI of the brain 08/24/2018 showed multiple T2/FLAIR hyperintense foci in the cerebral hemispheres.  Most of the foci are nonspecific  predominantly in the subcortical and deep white matter.  There are just a couple periventricular foci.  There are no T2/FLAIR hyperintense foci in the infratentorial white matter.  Metal artifact from an implanted hearing device is noted on the left.  M RI of the brain 07/06/2023 was unchanged compared to the MRI from 2019.  MRI of the lumbar spine 07/08/2023 showed multilevel degenerative changes.  At L5-S1 there is severe left neural foraminal narrowing which could affect the left L5 nerve root.  At L3-L4 there are degenerative changes more to the right that could affect both the right L3 and L4 nerve roots.  There was edema the  endplates.  MRI of the cervical spine 10/13/2023 shows multilevel degenerative changes.  This could lead to left C5, left C6 and right C7 nerve root compression.  MRI of the shoulder 10/13/2023 showed supraspinatus tendinosis, infraspinatus tendinosis, biceps tendinosis and osteoarthritis of the right glenohumeral joint.     REVIEW OF SYSTEMS: Constitutional: No fevers, chills, sweats, or change in appetite Eyes: No visual changes, double vision, eye pain Ear, nose and throat: No hearing loss, ear pain, nasal congestion, sore throat Cardiovascular: No chest pain, palpitations Respiratory:  No shortness of breath at rest or with exertion.   No wheezes GastrointestinaI: No nausea, vomiting, diarrhea, abdominal pain, fecal incontinence Genitourinary:  No dysuria, urinary retention or frequency.  No nocturia. Musculoskeletal:  No neck pain, back pain Integumentary: No rash, pruritus, skin lesions Neurological: as above Psychiatric: No depression at this time.  No anxiety Endocrine: No palpitations, diaphoresis, change in appetite, change in weigh or increased thirst Hematologic/Lymphatic:  No anemia, purpura, petechiae. Allergic/Immunologic: No itchy/runny eyes, nasal congestion, recent allergic reactions, rashes  ALLERGIES: No Known Allergies  HOME MEDICATIONS:  Current Outpatient Medications:    diltiazem  (CARDIZEM  CD) 120 MG 24 hr capsule, Take 1 capsule (120 mg total) by mouth daily., Disp: 90 capsule, Rfl: 3   rosuvastatin (CRESTOR) 20 MG tablet, Take 20 mg by mouth daily., Disp: , Rfl:   PAST MEDICAL HISTORY: Past Medical History:  Diagnosis Date   Hearing loss    Neuromuscular disorder (HCC)    lesions seen with MRI of brain    PAST SURGICAL HISTORY: Past Surgical History:  Procedure Laterality Date   CYSTOSCOPY WITH RETROGRADE PYELOGRAM, URETEROSCOPY AND STENT PLACEMENT Left 06/01/2014   Procedure: CYSTOSCOPY WITH RETROGRADE PYELOGRAM, URETEROSCOPY;  Surgeon: Gretel Ferrara, Brent Jones;  Location: [facility];  Service: Urology;  Laterality: Left;   FINGER SURGERY     HOLMIUM LASER APPLICATION Left 06/01/2014   Procedure: HOLMIUM LASER APPLICATION;  Surgeon: Gretel Ferrara, Brent Jones;  Location: [facility];  Service: Urology;  Laterality: Left;   INNER EAR SURGERY  implantable hearing aid-L   TONSILLECTOMY      FAMILY HISTORY: Family History  Problem Relation Age of Onset   Diabetes Father    Hypertension Father     SOCIAL HISTORY: Social History   Socioeconomic History   Marital status: Divorced    Spouse name: Not on file   Number of children: 3   Years of education: Not on file   Highest education level: Doctorate  Occupational History   Not on file  Tobacco Use   Smoking status: Never   Smokeless tobacco: Not on file  Vaping Use   Vaping status: Never Used  Substance and Sexual Activity   Alcohol use: Yes    Alcohol/week: 3.0 standard drinks of alcohol    Types: 3 Standard drinks or equivalent per week  Comment: occasionally   Drug use: No   Sexual activity: Yes    Birth control/protection: None  Other Topics Concern   Not on file  Social History Narrative   Not on file   Social Drivers of Health   Financial Resource Strain: Low Risk  (03/25/2022)   Received from [facility]   Overall Financial Resource Strain (CARDIA)    Difficulty of Paying Living Expenses: Not hard at all  Food Insecurity: No Food Insecurity (03/25/2022)   Received from [facility]   Hunger Vital Sign    Worried About Running Out of Food in the Last Year: Never true    Ran Out of Food in the Last Year: Never true  Transportation Needs: Not on file  Physical Activity: Sufficiently Active (03/25/2022)   Received from [facility]   Exercise Vital Sign    Days of Exercise per Week: 5 days    Minutes of Exercise per Session: 30 min  Stress: No Stress Concern Present (03/25/2022)   Received from [facility] of Occupational Health - Occupational Stress Questionnaire    Feeling of Stress : Not at all  Social Connections: Unknown (03/27/2023)   Received from [facility]   Social Network    Social Network: Not on file  Intimate Partner Violence: Unknown (03/27/2023)   Received from [facility]   HITS    Physically Hurt: Not on file    Insult or Talk Down To: Not on file    Threaten Physical Harm: Not on file    Scream or Curse: Not on file       PHYSICAL EXAM  Vitals:   01/13/24 0837  BP: 116/75  Pulse: 61  Weight: 186 lb 3.2 oz (84.5 kg)    Body mass index is 24.57 kg/m.   General: The patient is well-developed and well-nourished and in no acute distress  HEENT:  Head is [place]/AT.  Sclera are anicteric.  Funduscopic exam shows normal optic discs and retinal vessels.  Neck: No carotid bruits are noted.  The neck is nontender.  Cardiovascular: The heart has a regular rate and rhythm with a normal S1 and S2. There were no murmurs, gallops or rubs.    Skin: Extremities are without rash or  edema.  Musculoskeletal:  Back is nontender.  N he has good range of motion of the right shoulder.  Good range of motion of neck.  Neurologic Exam  Mental status: The patient is alert and oriented x 3 at the time of the examination. The patient has apparent normal recent and remote memory, with an apparently normal attention span and concentration ability.   Speech is normal.  Cranial nerves: Extraocular movements are full.  There is good facial sensation to soft touch bilaterally.Facial strength is normal.  Trapezius and sternocleidomastoid strength is normal. No dysarthria is noted.  . No obvious hearing deficits are noted.  Motor:  Muscle bulk is normal.   Tone is normal. Strength is  5 / 5 in all 4 extremities except 4+/5 in the right APB muscle and 4+/5 in right finger extensors.  Other C7 innervated muscles appeared normal.  Sensory:  Normal sensation in the feet and legs no dermatomal numbness or allodynia in the right arm  Coordination: Cerebellar testing reveals good finger-nose-finger and heel-to-shin bilaterally.  Gait and station: Station is normal.   Gait is normal. Tandem gait is normal. Romberg is  negative.   Reflexes: Deep tendon reflexes are symmetric and normal bilaterally.       DIAGNOSTIC DATA (LABS, IMAGING, TESTING) - I reviewed patient records, labs, notes, testing and imaging myself where available.  Lab Results  Component Value Date   WBC 15.3 (H) 12/15/2011   HGB 14.5 12/15/2011   HCT 42.6 12/15/2011   MCV 85.0 12/15/2011   PLT 210 12/15/2011      Component Value Date/Time   NA 136 (L) 06/01/2014 1605   K 4.2 06/01/2014 1605   CL 98 06/01/2014 1605   CO2 23 06/01/2014 1605   GLUCOSE 90 06/01/2014 1605   BUN 23 06/01/2014 1605   CREATININE 1.55 (H) 06/01/2014 1605   CALCIUM 9.9 06/01/2014 1605   PROT 7.9 12/15/2011 1051   ALBUMIN 4.7 12/15/2011 1051   AST 29 12/15/2011 1051   ALT 25 12/15/2011 1051   ALKPHOS 72 12/15/2011 1051   BILITOT 0.9 12/15/2011 1051   GFRNONAA 51 (L) 06/01/2014 1605   GFRAA 59 (L) 06/01/2014 1605       ASSESSMENT AND PLAN  White matter abnormality on MRI of brain  Right arm numbness  Right arm weakness  Chronic left-sided low back pain with left-sided sciatica   He will remain off of Copaxone.  I believe that he does not have MS.  After couple years of being off Copaxone we will reimage.   We discussed his musculoskeletal issues and he also has discussed this with orthopedics.  The shoulder has good range of motion and usually is not painful so he is not going to proceed with any intervention at this time.  In the neck he does have significant degenerative changes that could cause radiculopathy.  If the right arm symptoms worsen consider right C7 ESI.  He is planning on doing a left L5 ESI Return in 12 months or sooner if there are new or worsening  neurologic symptoms.  Arya Luttrull A. Vear, Brent Jones, [facility] 01/13/2024, 10:18 AM Certified in Neurology, Clinical Neurophysiology, Sleep Medicine and Neuroimaging  [facility] [address], [address] [place], [place] [address] [phone]

## 2024-01-19 ENCOUNTER — Other Ambulatory Visit: Payer: Self-pay | Admitting: Cardiovascular Disease

## 2024-01-29 ENCOUNTER — Encounter: Payer: Self-pay | Admitting: Neurology

## 2024-02-03 NOTE — Discharge Instructions (Signed)

## 2024-02-04 ENCOUNTER — Ambulatory Visit
Admission: RE | Admit: 2024-02-04 | Discharge: 2024-02-04 | Disposition: A | Discharge status: Home / Self Care | Payer: 59 | Source: Ambulatory Visit | Attending: Orthopedic Surgery | Admitting: Orthopedic Surgery

## 2024-02-04 DIAGNOSIS — M5417 Radiculopathy, lumbosacral region: Secondary | ICD-10-CM

## 2024-02-04 MED ORDER — METHYLPREDNISOLONE ACETATE 40 MG/ML INJ SUSP (RADIOLOG: 80.0000 mg | Freq: Once | INTRAMUSCULAR | Status: AC

## 2024-02-04 MED ORDER — IOPAMIDOL (ISOVUE-M 200) INJECTION 41%
1.0000 mL | Freq: Once | INTRAMUSCULAR | Status: AC
Start: 2024-02-04 — End: 2024-02-04
  Administered 2024-02-04: 1 mL via EPIDURAL

## 2024-02-04 MED ORDER — IOPAMIDOL (ISOVUE-M 200) INJECTION 41%: 1.0000 mL | Freq: Once | INTRAMUSCULAR | Status: DC

## 2024-02-04 MED ORDER — METHYLPREDNISOLONE ACETATE 40 MG/ML INJ SUSP (RADIOLOG
80.0000 mg | Freq: Once | INTRAMUSCULAR | Status: AC
Start: 2024-02-04 — End: 2024-02-04
  Administered 2024-02-04: 80 mg via EPIDURAL

## 2024-06-19 ENCOUNTER — Encounter: Payer: Self-pay | Admitting: Physician Assistant

## 2024-07-12 ENCOUNTER — Encounter: Payer: Self-pay | Admitting: Internal Medicine

## 2024-08-08 NOTE — Progress Notes (Signed)
 CARDIOLOGY CONSULT NOTE       Patient ID: Brent Cook, MD MRN: [id number] DOB/AGE: 60-Oct-1967 60 y.o.  Admit date: (Not on file) Referring Physician: Self Primary Physician: Sophronia Ozell BROCKS, MD Primary Cardiologist: Delford Reason for Consultation: AFib    HPI:  58 y.o. self referred OB/GYN doctor her in town for atrial fibrillation. History of MS, prior laser uretal surgery for stones  He called my partner Dr Dede and indicated needing to be seen 02/02/23  for afib.    He has had a coronary calcium score of 188 which is 84 th percentile for age/sex done 03/14/21.  Read as primarily in LM but to my review in proximal LAD He has been on Crestor for his lipids Sees Dr Sophronia at [facility] Has also been on Copaxone for his MS and abnormal brain MRI   He is active playing basketball and running He thinks he goes in/out of afib more noticeable since August of last year. No bleeding issues or contraindications to blood thinner WBC was low on MS med Tecfidera but now better  TTE 03/01/23 EF 60-65% NOrmal RV trivial MR otherwise normal LA 37 mm   Monitor ordered to see if chronic or paroxysmal Started on cardizem  and xarelto  Monitor with PAF infrequent   He did not note episode of PAF Has not gotten monitor Needs to check lipid/liver at his office Suspect he will need to go up on his crestor to hit target LDL of 70   Had ? 12 hours of PAF last month I reviewed Kardia strips and ? Sinus with PAC;s as rate was only in 60's  Had a nice [employer] cruise with friends around [place] Still playing basketball post call.    ROS All other systems reviewed and negative except as noted above  Past Medical History:  Diagnosis Date   Hearing loss    Neuromuscular disorder (HCC)    lesions seen with MRI of brain    Family History  Problem Relation Age of Onset   Diabetes Father    Hypertension Father     Social History   Socioeconomic History   Marital status: Divorced    Spouse name: Not on  file   Number of children: 3   Years of education: Not on file   Highest education level: Doctorate  Occupational History   Not on file  Tobacco Use   Smoking status: Never   Smokeless tobacco: Not on file  Vaping Use   Vaping status: Never Used  Substance and Sexual Activity   Alcohol use: Yes    Alcohol/week: 3.0 standard drinks of alcohol    Types: 3 Standard drinks or equivalent per week    Comment: occasionally   Drug use: No   Sexual activity: Yes    Birth control/protection: None  Other Topics Concern   Not on file  Social History Narrative   Not on file   Social Drivers of Health   Financial Resource Strain: Low Risk  (04/16/2024)   Received from [facility]   Overall Financial Resource Strain (CARDIA)    Difficulty of Paying Living Expenses: Not hard at all  Food Insecurity: No Food Insecurity (04/16/2024)   Received from [facility]   Hunger Vital Sign    Within the past 12 months, you worried that your food would run out before you got the money to buy more.: Never true    Within the past 12 months, the food you bought just didn't last and you  didn't have money to get more.: Never true  Transportation Needs: No Transportation Needs (04/16/2024)   Received from [facility]   PRAPARE - Transportation    Lack of Transportation (Medical): No    Lack of Transportation (Non-Medical): No  Physical Activity: Sufficiently Active (04/16/2024)   Received from [facility]   Exercise Vital Sign    On average, how many days per week do you engage in moderate to strenuous exercise (like a brisk walk)?: 5 days    On average, how many minutes do you engage in exercise at this level?: 90 min  Stress: No Stress Concern Present (04/16/2024)   Received from [facility] of Occupational Health - Occupational Stress Questionnaire    Feeling of Stress : Not at all  Social Connections: Socially Integrated (04/16/2024)   Received from [facility]   Social  Network    How would you rate your social network (family, work, friends)?: Good participation with social networks  Intimate Partner Violence: Not At Risk (04/16/2024)   Received from [facility]   HITS    Over the last 12 months how often did your partner physically hurt you?: Never    Over the last 12 months how often did your partner insult you or talk down to you?: Patient declined    Over the last 12 months how often did your partner threaten you with physical harm?: Never    Over the last 12 months how often did your partner scream or curse at you?: Patient declined    Past Surgical History:  Procedure Laterality Date   CYSTOSCOPY WITH RETROGRADE PYELOGRAM, URETEROSCOPY AND STENT PLACEMENT Left 06/01/2014   Procedure: CYSTOSCOPY WITH RETROGRADE PYELOGRAM, URETEROSCOPY;  Surgeon: Gretel Ferrara, MD;  Location: [facility];  Service: Urology;  Laterality: Left;   FINGER SURGERY     HOLMIUM LASER APPLICATION Left 06/01/2014   Procedure: HOLMIUM LASER APPLICATION;  Surgeon: Gretel Ferrara, MD;  Location: [facility];  Service: Urology;  Laterality: Left;   INNER EAR SURGERY  implantable hearing aid-L   TONSILLECTOMY        Current Outpatient Medications:    diltiazem  (CARDIZEM  CD) 120 MG 24 hr capsule, TAKE 1 CAPSULE BY MOUTH EVERY DAY, Disp: 90 capsule, Rfl: 0   ezetimibe (ZETIA) 10 MG tablet, Take 10 mg by mouth daily., Disp: , Rfl:    rosuvastatin (CRESTOR) 20 MG tablet, Take 20 mg by mouth daily., Disp: , Rfl:     Physical Exam: Blood pressure 108/78, pulse 73, height 6' 1 (1.854 m), weight 183 lb 9.6 oz (83.3 kg), SpO2 96%.    Affect appropriate Healthy:  appears stated age HEENT: hearing loss with aids  Neck supple with no adenopathy JVP normal no bruits no thyromegaly Lungs clear with no wheezing and good diaphragmatic motion Heart:  S1/S2 no murmur, no rub, gallop or click PMI normal Abdomen: benighn, BS positve, no tenderness, no AAA no bruit.  No HSM or HJR Distal pulses  intact with no bruits No edema Neuro non-focal Skin warm and dry No muscular weakness   Labs:   Lab Results  Component Value Date   WBC 15.3 (H) 12/15/2011   HGB 14.5 12/15/2011   HCT 42.6 12/15/2011   MCV 85.0 12/15/2011   PLT 210 12/15/2011   No results for input(s): NA, K, CL, CO2, BUN, CREATININE, CALCIUM, PROT, BILITOT, ALKPHOS, ALT, AST, GLUCOSE in the last 168 hours.  Invalid input(s): LABALBU No results found for: CKTOTAL, CKMB, CKMBINDEX,  TROPONINI No results found for: CHOL No results found for: HDL No results found for: LDLCALC No results found for: TRIG No results found for: CHOLHDL No results found for: LDLDIRECT    Radiology: No results found.  EKG: brought by patient afib non specific ST changes 03/12/23 SR rate 56 normal PR 222 msec    ASSESSMENT AND PLAN:   Afib:  ? Paroxysmal CHADVASC 0 frequency of PAF ill defined Discussed Kardia Mobile device with patientT. Given high calcium in LAD wouild not use flecainide Continue cardizem  TTE benign If he has more frequent and sustained episodes will refer to EP for ablation MS:  F/U Dr Pamela now off MS meds  Urology:  post stent Renda no recurrent stones  HLD:  on crestor and zetia he will get labs including LpA in his office and send them to me    F/U in a year   Signed: Maude Emmer 08/15/2024, 9:02 AM

## 2024-08-14 ENCOUNTER — Ambulatory Visit: Admitting: Cardiovascular Disease

## 2024-08-15 ENCOUNTER — Ambulatory Visit: Attending: Cardiovascular Disease | Admitting: Cardiovascular Disease

## 2024-08-15 VITALS — BP 108/78 | HR 73 | Ht 73.0 in | Wt 183.6 lb

## 2024-08-15 DIAGNOSIS — I4891 Unspecified atrial fibrillation: Secondary | ICD-10-CM | POA: Diagnosis not present

## 2024-08-15 MED ORDER — DILTIAZEM HCL ER COATED BEADS 120 MG PO CP24: 120.0000 mg | ORAL_CAPSULE | Freq: Every day | ORAL | 3 refills | Status: DC

## 2024-08-15 NOTE — Patient Instructions (Signed)
 Medication Instructions:  Your physician recommends that you continue on your current medications as directed. Please refer to the Current Medication list given to you today.  *If you need a refill on your cardiac medications before your next appointment, please call your pharmacy*  Lab Work: If you have labs (blood work) drawn today and your tests are completely normal, you will receive your results only by: MyChart Message (if you have MyChart) OR A paper copy in the mail If you have any lab test that is abnormal or we need to change your treatment, we will call you to review the results.  Testing/Procedures: None ordered today.  Follow-Up: At [facility], you and your health needs are our priority.  As part of our continuing mission to provide you with exceptional heart care, our providers are all part of one team.  This team includes your primary Cardiologist (physician) and Advanced Practice Providers or APPs (Physician Assistants and Nurse Practitioners) who all work together to provide you with the care you need, when you need it.  Your next appointment:   1 year   Provider:   Dr. Delford  We recommend signing up for the patient portal called MyChart.  Sign up information is provided on this After Visit Summary.  MyChart is used to connect with patients for Virtual Visits (Telemedicine).  Patients are able to view lab/test results, encounter notes, upcoming appointments, etc.  Non-urgent messages can be sent to your provider as well.   To learn more about what you can do with MyChart, go to [email or web address].   Other Instructions

## 2024-09-03 ENCOUNTER — Encounter: Payer: Self-pay | Admitting: Cardiovascular Disease

## 2024-09-11 ENCOUNTER — Encounter: Payer: Self-pay | Admitting: Internal Medicine

## 2024-09-11 ENCOUNTER — Ambulatory Visit (AMBULATORY_SURGERY_CENTER)

## 2024-09-11 VITALS — Ht 73.0 in | Wt 175.0 lb

## 2024-09-11 DIAGNOSIS — Z1211 Encounter for screening for malignant neoplasm of colon: Secondary | ICD-10-CM

## 2024-09-11 MED ORDER — NA SULFATE-K SULFATE-MG SULF 17.5-3.13-1.6 GM/177ML PO SOLN: 1.0000 | Freq: Once | ORAL | 0 refills | Status: AC

## 2024-09-11 NOTE — Progress Notes (Signed)
 Pre visit completed via phone call; Patient verified name, DOB, and address; No egg or soy allergy known to patient  No issues known to pt with past sedation with any surgeries or procedures; Patient denies ever being told they had issues or difficulty with intubation;  No FH of Malignant Hyperthermia; Pt is not on diet pills; Pt is not on home 02;  Pt is not on blood thinners;  Pt denies issues with constipation  AFIB dx 2024 Have any cardiac testing pending--NO Insurance verified during PV appt--- UHC Pt can ambulate without assistance;  Pt denies use of chewing tobacco Discussed diabetic/weight loss medication holds; Discussed NSAID holds; Checked BMI to be less than 50; Pt instructed to use Singlecare.com or GoodRx for a price reduction on prep;  Patient's chart reviewed by Norleen Schillings CNRA prior to previsit and patient appropriate for the [facility];  Pre visit completed and red dot placed by patient's name on their procedure day (on provider's schedule); Instructions sent to MyChart per his request;

## 2024-09-29 ENCOUNTER — Encounter: Payer: Self-pay | Admitting: Internal Medicine

## 2024-09-29 ENCOUNTER — Ambulatory Visit (AMBULATORY_SURGERY_CENTER): Admitting: Internal Medicine

## 2024-09-29 VITALS — BP 111/69 | HR 54 | Temp 97.3°F | Resp 13 | Ht 73.0 in | Wt 175.0 lb

## 2024-09-29 DIAGNOSIS — Z1211 Encounter for screening for malignant neoplasm of colon: Secondary | ICD-10-CM

## 2024-09-29 DIAGNOSIS — D120 Benign neoplasm of cecum: Secondary | ICD-10-CM | POA: Diagnosis not present

## 2024-09-29 DIAGNOSIS — Z860101 Personal history of adenomatous and serrated colon polyps: Secondary | ICD-10-CM | POA: Diagnosis not present

## 2024-09-29 DIAGNOSIS — D125 Benign neoplasm of sigmoid colon: Secondary | ICD-10-CM | POA: Diagnosis not present

## 2024-09-29 DIAGNOSIS — D123 Benign neoplasm of transverse colon: Secondary | ICD-10-CM

## 2024-09-29 MED ORDER — SODIUM CHLORIDE 0.9 % IV SOLN: 500.0000 mL | Freq: Once | INTRAVENOUS | Status: DC

## 2024-09-29 NOTE — Progress Notes (Signed)
 GASTROENTEROLOGY PROCEDURE H&P NOTE   Primary Care Physician: Sophronia Ozell BROCKS, MD    Reason for Procedure:  History of adenomatous polyps  Plan:    Colonoscopy  Patient is appropriate for endoscopic procedure(s) in the ambulatory ([facility]) setting.  The nature of the procedure, as well as the risks, benefits, and alternatives were carefully and thoroughly reviewed with the patient. Ample time for discussion and questions allowed. The patient understood, was satisfied, and agreed to proceed.     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  Medical history as below.  Tolerated the prep.  No recent chest pain or shortness of breath.  No abdominal pain today.  Past Medical History:  Diagnosis Date   A-fib [facility]) 2024   Hearing loss    Hyperlipidemia    on med   Neuromuscular disorder (HCC)    lesions seen with MRI of brain    Past Surgical History:  Procedure Laterality Date   COLONOSCOPY  2020   Dr. Luis   CYSTOSCOPY WITH RETROGRADE PYELOGRAM, URETEROSCOPY AND STENT PLACEMENT Left 06/01/2014   Procedure: CYSTOSCOPY WITH RETROGRADE PYELOGRAM, URETEROSCOPY;  Surgeon: Gretel Ferrara, MD;  Location: [facility];  Service: Urology;  Laterality: Left;   FINGER SURGERY     HOLMIUM LASER APPLICATION Left 06/01/2014   Procedure: HOLMIUM LASER APPLICATION;  Surgeon: Gretel Ferrara, MD;  Location: [facility];  Service: Urology;  Laterality: Left;   INNER EAR SURGERY  implantable hearing aid-L   TONSILLECTOMY      Prior to Admission medications   Medication Sig Start Date End Date Taking? Authorizing Provider  diltiazem  (CARDIZEM  CD) 120 MG 24 hr capsule Take 1 capsule (120 mg total) by mouth daily. 08/15/24  Yes Nishan, Peter C, MD  doxycycline (VIBRAMYCIN) 100 MG capsule Take 100 mg by mouth daily. 09/15/24  Yes [provider]  ezetimibe (ZETIA) 10 MG tablet Take 10 mg by mouth daily.   Yes [provider]  rosuvastatin (CRESTOR) 20 MG  tablet Take 20 mg by mouth daily.   Yes [provider]    Current Outpatient Medications  Medication Sig Dispense Refill   diltiazem  (CARDIZEM  CD) 120 MG 24 hr capsule Take 1 capsule (120 mg total) by mouth daily. 90 capsule 3   doxycycline (VIBRAMYCIN) 100 MG capsule Take 100 mg by mouth daily.     ezetimibe (ZETIA) 10 MG tablet Take 10 mg by mouth daily.     rosuvastatin (CRESTOR) 20 MG tablet Take 20 mg by mouth daily.     Current Facility-Administered Medications  Medication Dose Route Frequency Provider Last Rate Last Admin   0.9 %  sodium chloride  infusion  500 mL Intravenous Once Makya Yurko, Gordy HERO, MD        Allergies as of 09/29/2024   (No Known Allergies)    Family History  Problem Relation Age of Onset   Diabetes Father    Hypertension Father    Colon cancer Neg Hx    Esophageal cancer Neg Hx    Rectal cancer Neg Hx    Stomach cancer Neg Hx     Social History   Socioeconomic History   Marital status: Divorced    Spouse name: Not on file   Number of children: 3   Years of education: Not on file   Highest education level: Doctorate  Occupational History   Not on file  Tobacco Use   Smoking status: Never   Smokeless tobacco: Never  Vaping Use   Vaping status: Never Used  Substance and Sexual Activity   Alcohol use: Yes    Alcohol/week: 3.0 standard drinks of alcohol    Types: 3 Standard drinks or equivalent per week    Comment: occasionally   Drug use: No   Sexual activity: Yes    Birth control/protection: None  Other Topics Concern   Not on file  Social History Narrative   Not on file   Social Drivers of Health   Financial Resource Strain: Low Risk  (04/16/2024)   Received from [facility]   Overall Financial Resource Strain (CARDIA)    Difficulty of Paying Living Expenses: Not hard at all  Food Insecurity: No Food Insecurity (04/16/2024)   Received [facility]   Hunger Vital Sign    Within the past 12 months, you worried that  your food would run out before you got the money to buy more.: Never true    Within the past 12 months, the food you bought just didn't last and you didn't have money to get more.: Never true  Transportation Needs: No Transportation Needs (04/16/2024)   Received from [facility] - Transportation    Lack of Transportation (Medical): No    Lack of Transportation (Non-Medical): No  Physical Activity: Sufficiently Active (04/16/2024)   Received from [facility]   Exercise Vital Sign    On average, how many days per week do you engage in moderate to strenuous exercise (like a brisk walk)?: 5 days    On average, how many minutes do you engage in exercise at this level?: 90 min  Stress: No Stress Concern Present (04/16/2024)   Received from [facility] - Occupational Stress Questionnaire    Feeling of Stress : Not at all  Social Connections: Socially Integrated (04/16/2024)   Received from [facility]   Social Network    How would you rate your social network (family, work, friends)?: Good participation with social networks  Intimate Partner Violence: Not At Risk (04/16/2024)   Received from [facility]   HITS    Over the last 12 months how often did your partner physically hurt you?: Never    Over the last 12 months how often did your partner insult you or talk down to you?: Patient declined    Over the last 12 months how often did your partner threaten you with physical harm?: Never    Over the last 12 months how often did your partner scream or curse at you?: Patient declined    Physical Exam: Vital signs in last 24 hours: @BP  134/85   Pulse 63   Temp (!) 97.3 F (36.3 C) (Temporal)   Ht 6' 1 (1.854 m)   Wt 175 lb (79.4 kg)   SpO2 97%   BMI 23.09 kg/m  GEN: NAD EYE: Sclerae anicteric ENT: MMM CV: Non-tachycardic Pulm: CTA b/l GI: Soft, NT/ND NEURO:  Alert & Oriented x 3   [name], MD [place]  [facility]  09/29/2024 8:03 AM

## 2024-09-29 NOTE — Progress Notes (Signed)
 Sedate, gd SR, tolerated procedure well, VSS, report to RN

## 2024-09-29 NOTE — Progress Notes (Signed)
 Called to room to assist during endoscopic procedure.  Patient ID and intended procedure confirmed with present staff. Received instructions for my participation in the procedure from the performing physician.

## 2024-09-29 NOTE — Progress Notes (Signed)
 Pt's states no medical or surgical changes since previsit or office visit.

## 2024-09-29 NOTE — Op Note (Signed)
  [facility] Patient Name: Brent Jones Procedure Date: 09/29/2024 7:59 AM MRN: [id number] Endoscopist: Gordy CHRISTELLA Starch , MD, [id number] Age: 58 Referring MD:  Date of Birth: 1966/12/01 Gender: Male Account #: [id number] Procedure:                Colonoscopy Indications:              High risk colon cancer surveillance: Personal                            history of non-advanced adenomas (TA x 2), 2015                            (small polyp) Medicines:                Monitored Anesthesia Care Procedure:                Pre-Anesthesia Assessment:                           - Prior to the procedure, a History and Physical                            was performed, and patient medications and                            allergies were reviewed. The patient's tolerance of                            previous anesthesia was also reviewed. The risks                            and benefits of the procedure and the sedation                            options and risks were discussed with the patient.                            All questions were answered, and informed consent                            was obtained. Prior Anticoagulants: The patient has                            taken no anticoagulant or antiplatelet agents. ASA                            Grade Assessment: II - A patient with mild systemic                            disease. After reviewing the risks and benefits,                            the patient was deemed in satisfactory condition to  undergo the procedure.                           After obtaining informed consent, the colonoscope                            was passed under direct vision. Throughout the                            procedure, the patient's blood pressure, pulse, and                            oxygen saturations were monitored continuously. The                            CF [id number] #[id number] was introduced through the anus                             and advanced to the cecum, identified by                            appendiceal orifice and ileocecal valve. The                            colonoscopy was performed without difficulty. The                            patient tolerated the procedure well. The quality                            of the bowel preparation was good. The ileocecal                            valve, appendiceal orifice, and rectum were                            photographed. Scope In: 8:06:00 AM Scope Out: 8:28:36 AM Scope Withdrawal Time: 0 hours 17 minutes 20 seconds  Total Procedure Duration: 0 hours 22 minutes 36 seconds  Findings:                 The digital rectal exam was normal.                           A 1 mm polyp was found in the cecum. The polyp was                            sessile. The polyp was removed with a cold biopsy                            forceps. Resection and retrieval were complete.                           A 6 mm polyp was found in the cecum. The polyp was  sessile. The polyp was removed with a cold snare.                            Resection and retrieval were complete.                           A 2 mm polyp was found in the transverse colon. The                            polyp was sessile. The polyp was removed with a                            cold biopsy forceps. Resection and retrieval were                            complete.                           A 5 mm polyp was found in the transverse colon. The                            polyp was sessile. The polyp was removed with a                            cold snare. Resection and retrieval were complete.                           A 4 mm polyp was found in the sigmoid colon. The                            polyp was sessile. The polyp was removed with a                            cold snare. Resection and retrieval were complete.                           The retroflexed view of the  distal rectum and anal                            verge was normal and showed no anal or rectal                            abnormalities. Complications:            No immediate complications. Estimated Blood Loss:     Estimated blood loss: none. Impression:               - One 1 mm polyp in the cecum, removed with a cold                            biopsy forceps. Resected and retrieved.                           -  One 6 mm polyp in the cecum, removed with a cold                            snare. Resected and retrieved.                           - One 2 mm polyp in the transverse colon, removed                            with a cold biopsy forceps. Resected and retrieved.                           - One 5 mm polyp in the transverse colon, removed                            with a cold snare. Resected and retrieved.                           - One 4 mm polyp in the sigmoid colon, removed with                            a cold snare. Resected and retrieved.                           - The distal rectum and anal verge are normal on                            retroflexion view. Recommendation:           - Patient has a contact number available for                            emergencies. The signs and symptoms of potential                            delayed complications were discussed with the                            patient. Return to normal activities tomorrow.                            Written discharge instructions were provided to the                            patient.                           - Resume previous diet.                           - Continue present medications.                           - Await pathology results.                           -  Repeat colonoscopy is recommended for                            surveillance. The colonoscopy date will be                            determined after pathology results from today's                            exam become available  for review. Gordy CHRISTELLA Starch, MD 09/29/2024 8:35:01 AM This report has been signed electronically.

## 2024-09-29 NOTE — Patient Instructions (Addendum)
 Handouts given: Polyps Resume previous diet. Continue present medications.  Await pathology results. Repeat colonoscopy is recommended for surveillance. The colonoscopy date will be determined after results from today's exam become available for review.  YOU HAD AN ENDOSCOPIC PROCEDURE TODAY AT THE [facility]:   Refer to the procedure report that was given to you for any specific questions about what was found during the examination.  If the procedure report does not answer your questions, please call your gastroenterologist to clarify.  If you requested that your care partner not be given the details of your procedure findings, then the procedure report has been included in a sealed envelope for you to review at your convenience later.  YOU SHOULD EXPECT: Some feelings of bloating in the abdomen. Passage of more gas than usual.  Walking can help get rid of the air that was put into your GI tract during the procedure and reduce the bloating. If you had a lower endoscopy (such as a colonoscopy or flexible sigmoidoscopy) you may notice spotting of blood in your stool or on the toilet paper. If you underwent a bowel prep for your procedure, you may not have a normal bowel movement for a few days.  Please Note:  You might notice some irritation and congestion in your nose or some drainage.  This is from the oxygen used during your procedure.  There is no need for concern and it should clear up in a day or so.  SYMPTOMS TO REPORT IMMEDIATELY:  Following lower endoscopy (colonoscopy or flexible sigmoidoscopy):  Excessive amounts of blood in the stool  Significant tenderness or worsening of abdominal pains  Swelling of the abdomen that is new, acute  Fever of 100F or higher  For urgent or emergent issues, a gastroenterologist can be reached at any hour by calling [phone]. Do not use MyChart messaging for urgent concerns.    DIET:  We do recommend a small meal at first, but then  you may proceed to your regular diet.  Drink plenty of fluids but you should avoid alcoholic beverages for 24 hours.  ACTIVITY:  You should plan to take it easy for the rest of today and you should NOT DRIVE or use heavy machinery until tomorrow (because of the sedation medicines used during the test).    FOLLOW UP: Our staff will call the number listed on your records the next business day following your procedure.  We will call around 7:15- 8:00 am to check on you and address any questions or concerns that you may have regarding the information given to you following your procedure. If we do not reach you, we will leave a message.     If any biopsies were taken you will be contacted by phone or by letter within the next 1-3 weeks.  Please call us  at [phone] if you have not heard about the biopsies in 3 weeks.    SIGNATURES/CONFIDENTIALITY: You and/or your care partner have signed paperwork which will be entered into your electronic medical record.  These signatures attest to the fact that that the information above on your After Visit Summary has been reviewed and is understood.  Full responsibility of the confidentiality of this discharge information lies with you and/or your care-partner.

## 2024-10-02 ENCOUNTER — Telehealth: Payer: Self-pay | Admitting: *Deleted

## 2024-10-02 NOTE — Telephone Encounter (Signed)
  Follow up Call-     09/29/2024    7:16 AM  Call back number  Post procedure Call Back phone  # [phone]  Permission to leave phone message Yes     Patient questions:  Do you have a fever, pain , or abdominal swelling? No. Pain Score  0 *  Have you tolerated food without any problems? Yes.    Have you been able to return to your normal activities? Yes.    Do you have any questions about your discharge instructions: Diet   No. Medications  No. Follow up visit  No.  Do you have questions or concerns about your Care? No.  Actions: * If pain score is 4 or above: No action needed, pain <4.

## 2024-10-04 LAB — SURGICAL PATHOLOGY

## 2024-10-05 ENCOUNTER — Ambulatory Visit: Payer: Self-pay | Admitting: Internal Medicine

## 2024-10-30 ENCOUNTER — Other Ambulatory Visit: Payer: Self-pay | Admitting: Cardiovascular Disease

## 2025-01-18 ENCOUNTER — Ambulatory Visit: Payer: 59 | Admitting: Neurology
